# Patient Record
Sex: Female | Born: 1935 | Race: White | Hispanic: No
Health system: Southern US, Community
[De-identification: ages and names within clinical notes are randomized; demographics above are authoritative.]

---

## 2004-10-29 ENCOUNTER — Ambulatory Visit: Payer: Self-pay | Admitting: Unknown Physician Specialty

## 2005-11-25 ENCOUNTER — Ambulatory Visit: Payer: Self-pay | Admitting: Unknown Physician Specialty

## 2006-07-06 ENCOUNTER — Ambulatory Visit: Payer: Self-pay | Admitting: Unknown Physician Specialty

## 2006-12-26 ENCOUNTER — Ambulatory Visit: Payer: Self-pay | Admitting: Ophthalmology

## 2007-02-07 ENCOUNTER — Ambulatory Visit: Payer: Self-pay | Admitting: Unknown Physician Specialty

## 2007-12-26 ENCOUNTER — Ambulatory Visit: Payer: Self-pay | Admitting: Unknown Physician Specialty

## 2008-01-11 ENCOUNTER — Ambulatory Visit: Payer: Self-pay | Admitting: Unknown Physician Specialty

## 2008-01-25 ENCOUNTER — Ambulatory Visit: Payer: Self-pay | Admitting: Unknown Physician Specialty

## 2008-02-28 ENCOUNTER — Ambulatory Visit: Payer: Self-pay | Admitting: Unknown Physician Specialty

## 2008-08-08 ENCOUNTER — Ambulatory Visit: Payer: Self-pay | Admitting: Unknown Physician Specialty

## 2008-10-15 ENCOUNTER — Ambulatory Visit: Payer: Self-pay | Admitting: Unknown Physician Specialty

## 2009-03-03 ENCOUNTER — Ambulatory Visit: Payer: Self-pay | Admitting: Unknown Physician Specialty

## 2009-08-26 ENCOUNTER — Encounter: Payer: Self-pay | Admitting: Gastroenterology

## 2009-08-26 ENCOUNTER — Inpatient Hospital Stay: Payer: Self-pay | Admitting: Internal Medicine

## 2009-09-05 ENCOUNTER — Encounter: Payer: Self-pay | Admitting: Gastroenterology

## 2009-09-10 ENCOUNTER — Encounter: Payer: Self-pay | Admitting: Gastroenterology

## 2009-10-01 ENCOUNTER — Encounter (INDEPENDENT_AMBULATORY_CARE_PROVIDER_SITE_OTHER): Payer: Self-pay | Admitting: *Deleted

## 2009-10-06 ENCOUNTER — Encounter: Payer: Self-pay | Admitting: Gastroenterology

## 2009-10-06 DIAGNOSIS — K863 Pseudocyst of pancreas: Secondary | ICD-10-CM

## 2009-10-06 DIAGNOSIS — K862 Cyst of pancreas: Secondary | ICD-10-CM | POA: Insufficient documentation

## 2009-10-10 ENCOUNTER — Telehealth: Payer: Self-pay | Admitting: Gastroenterology

## 2009-10-16 ENCOUNTER — Ambulatory Visit: Payer: Self-pay | Admitting: Gastroenterology

## 2009-10-16 ENCOUNTER — Ambulatory Visit (HOSPITAL_COMMUNITY): Admission: RE | Admit: 2009-10-16 | Discharge: 2009-10-16 | Payer: Self-pay | Admitting: Gastroenterology

## 2009-10-16 ENCOUNTER — Encounter: Payer: Self-pay | Admitting: Gastroenterology

## 2009-10-28 ENCOUNTER — Ambulatory Visit: Payer: Self-pay | Admitting: Gastroenterology

## 2009-11-24 ENCOUNTER — Ambulatory Visit: Payer: Self-pay | Admitting: Unknown Physician Specialty

## 2009-12-09 ENCOUNTER — Telehealth (INDEPENDENT_AMBULATORY_CARE_PROVIDER_SITE_OTHER): Payer: Self-pay | Admitting: *Deleted

## 2010-02-03 ENCOUNTER — Ambulatory Visit: Payer: Self-pay | Admitting: Urology

## 2010-02-16 ENCOUNTER — Encounter (INDEPENDENT_AMBULATORY_CARE_PROVIDER_SITE_OTHER): Payer: Self-pay | Admitting: *Deleted

## 2010-04-20 ENCOUNTER — Ambulatory Visit: Payer: Self-pay | Admitting: Unknown Physician Specialty

## 2010-07-05 ENCOUNTER — Observation Stay: Payer: Self-pay | Admitting: Internal Medicine

## 2010-08-12 ENCOUNTER — Ambulatory Visit: Payer: Self-pay | Admitting: Unknown Physician Specialty

## 2010-12-26 ENCOUNTER — Inpatient Hospital Stay: Payer: Self-pay | Admitting: Specialist

## 2011-01-12 NOTE — Letter (Signed)
Summary: Appointment Reminder  [facility]  [address] [place], [place] [address]   Phone: [phone]  Fax: [phone]        February 16, 2010 MRN: [id number]    Caitlin Caldwell [address] [place], [place]  [address]    Dear Ms. Vandruff,   We have been made aware that you cancelled an appointment that was  scheduled for you with Dr Donell Beers at [facility].  It is  very important that you reschedule this appointment to follow up with  your Pancrease, this could potentially lead to cancer.  Please call to  reschedule the appointment that was recommended.  We hope that you   allow [place] to participate in your health care needs. Please contact us at  [phone] at your earliest convenience to schedule your appointment.     Sincerely,    Chales Abrahams CMA (AAMA)  Appended Document: Appointment Reminder letter mailed

## 2011-03-18 ENCOUNTER — Ambulatory Visit: Payer: Self-pay | Admitting: Unknown Physician Specialty

## 2011-08-25 ENCOUNTER — Ambulatory Visit: Payer: Self-pay | Admitting: Unknown Physician Specialty

## 2011-09-06 ENCOUNTER — Ambulatory Visit: Payer: Self-pay | Admitting: Unknown Physician Specialty

## 2011-11-11 ENCOUNTER — Emergency Department: Payer: Self-pay | Admitting: Emergency Medicine

## 2011-12-15 ENCOUNTER — Emergency Department: Payer: Self-pay

## 2011-12-15 LAB — COMPREHENSIVE METABOLIC PANEL
Albumin: 3.7 g/dL (ref 3.4–5.0)
Alkaline Phosphatase: 56 U/L (ref 50–136)
Bilirubin,Total: 0.4 mg/dL (ref 0.2–1.0)
Co2: 23 mmol/L (ref 21–32)
Creatinine: 0.92 mg/dL (ref 0.60–1.30)
EGFR (Non-African Amer.): >60
Glucose: 286 mg/dL — ABNORMAL HIGH (ref 65–99)
Osmolality: 293 (ref 275–301)
Potassium: 4.5 mmol/L (ref 3.5–5.1)
SGPT (ALT): 8 U/L — ABNORMAL LOW
Sodium: 141 mmol/L (ref 136–145)
Total Protein: 7.4 g/dL (ref 6.4–8.2)

## 2011-12-15 LAB — URINALYSIS, COMPLETE
Glucose,UR: 150 mg/dL (ref 0–75)
Nitrite: NEGATIVE
Ph: 5 (ref 4.5–8.0)
Protein: 100
RBC,UR: 8 /HPF (ref 0–5)
Specific Gravity: 1.016 (ref 1.003–1.030)
WBC UR: 132 /HPF (ref 0–5)

## 2011-12-15 LAB — CBC
HGB: 10.9 g/dL — ABNORMAL LOW (ref 12.0–16.0)
MCH: 32.5 pg (ref 26.0–34.0)
MCHC: 35.1 g/dL (ref 32.0–36.0)
Platelet: 234 10*3/uL (ref 150–440)
RBC: 3.36 10*6/uL — ABNORMAL LOW (ref 3.80–5.20)
WBC: 12.9 10*3/uL — ABNORMAL HIGH (ref 3.6–11.0)

## 2011-12-17 LAB — URINE CULTURE

## 2012-01-19 ENCOUNTER — Ambulatory Visit: Payer: Self-pay | Admitting: Unknown Physician Specialty

## 2012-02-24 ENCOUNTER — Ambulatory Visit: Payer: Self-pay | Admitting: Unknown Physician Specialty

## 2012-03-08 ENCOUNTER — Ambulatory Visit: Payer: Self-pay | Admitting: Unknown Physician Specialty

## 2012-03-08 LAB — CREATININE, SERUM
Creatinine: 1.02 mg/dL (ref 0.60–1.30)
EGFR (African American): >60

## 2012-06-07 ENCOUNTER — Other Ambulatory Visit: Payer: Self-pay | Admitting: Surgery

## 2012-06-07 LAB — CBC WITH DIFFERENTIAL/PLATELET
Basophil %: 0.4 %
Eosinophil %: 0.8 %
HCT: 29.5 % — ABNORMAL LOW (ref 35.0–47.0)
Lymphocyte #: 1.6 10*3/uL (ref 1.0–3.6)
Lymphocyte %: 22 %
Monocyte %: 7.9 %

## 2012-06-07 LAB — COMPREHENSIVE METABOLIC PANEL
Anion Gap: 9 (ref 7–16)
Co2: 19 mmol/L — ABNORMAL LOW (ref 21–32)
Creatinine: 1.05 mg/dL (ref 0.60–1.30)
EGFR (African American): >60
EGFR (Non-African Amer.): 52 — ABNORMAL LOW
Osmolality: 286 (ref 275–301)
Potassium: 4.6 mmol/L (ref 3.5–5.1)
SGOT(AST): 17 U/L (ref 15–37)
SGPT (ALT): 15 U/L
Sodium: 139 mmol/L (ref 136–145)

## 2012-06-08 ENCOUNTER — Ambulatory Visit: Payer: Self-pay | Admitting: Surgery

## 2012-12-06 ENCOUNTER — Inpatient Hospital Stay: Payer: Self-pay | Admitting: Internal Medicine

## 2012-12-06 LAB — COMPREHENSIVE METABOLIC PANEL
Albumin: 3.5 g/dL (ref 3.4–5.0)
Anion Gap: 12 (ref 7–16)
Bilirubin,Total: 0.7 mg/dL (ref 0.2–1.0)
Chloride: 98 mmol/L (ref 98–107)
Co2: 27 mmol/L (ref 21–32)
Creatinine: 1.94 mg/dL — ABNORMAL HIGH (ref 0.60–1.30)
EGFR (African American): 28 — ABNORMAL LOW
EGFR (Non-African Amer.): 25 — ABNORMAL LOW
Osmolality: 309 (ref 275–301)
SGPT (ALT): 8 U/L — ABNORMAL LOW (ref 12–78)
Sodium: 137 mmol/L (ref 136–145)

## 2012-12-06 LAB — CBC
HGB: 9.6 g/dL — ABNORMAL LOW (ref 12.0–16.0)
MCHC: 30.4 g/dL — ABNORMAL LOW (ref 32.0–36.0)
MCV: 92 fL (ref 80–100)
Platelet: 218 10*3/uL (ref 150–440)
RBC: 3.43 10*6/uL — ABNORMAL LOW (ref 3.80–5.20)
RDW: 15.5 % — ABNORMAL HIGH (ref 11.5–14.5)
WBC: 10.6 10*3/uL (ref 3.6–11.0)

## 2012-12-06 LAB — URINALYSIS, COMPLETE
Bilirubin,UR: NEGATIVE
Glucose,UR: 150 mg/dL (ref 0–75)
RBC,UR: 25 /HPF (ref 0–5)
Specific Gravity: 1.01 (ref 1.003–1.030)
Squamous Epithelial: <1
WBC UR: 142 /HPF (ref 0–5)

## 2012-12-06 LAB — PRO B NATRIURETIC PEPTIDE: B-Type Natriuretic Peptide: 26346 pg/mL — ABNORMAL HIGH (ref 0–450)

## 2012-12-07 LAB — LIPID PANEL
HDL Cholesterol: 29 mg/dL — ABNORMAL LOW (ref 40–60)
Ldl Cholesterol, Calc: 38 mg/dL (ref 0–100)
Triglycerides: 105 mg/dL (ref 0–200)

## 2012-12-07 LAB — CBC WITH DIFFERENTIAL/PLATELET
Basophil #: 0 10*3/uL (ref 0.0–0.1)
Eosinophil #: 0 10*3/uL (ref 0.0–0.7)
Eosinophil %: 0.2 %
HCT: 27.3 % — ABNORMAL LOW (ref 35.0–47.0)
Lymphocyte #: 1.3 10*3/uL (ref 1.0–3.6)
MCHC: 32.2 g/dL (ref 32.0–36.0)
Monocyte #: 0.8 x10 3/mm (ref 0.2–0.9)
RBC: 3.04 10*6/uL — ABNORMAL LOW (ref 3.80–5.20)
RDW: 15.5 % — ABNORMAL HIGH (ref 11.5–14.5)
WBC: 7.7 10*3/uL (ref 3.6–11.0)

## 2012-12-07 LAB — CK TOTAL AND CKMB (NOT AT ARMC)
CK, Total: 67 U/L (ref 21–215)
CK-MB: <0.5 ng/mL — ABNORMAL LOW (ref 0.5–3.6)
CK-MB: 0.8 ng/mL (ref 0.5–3.6)

## 2012-12-07 LAB — BASIC METABOLIC PANEL
Calcium, Total: 9.1 mg/dL (ref 8.5–10.1)
Chloride: 100 mmol/L (ref 98–107)
Co2: 31 mmol/L (ref 21–32)
EGFR (Non-African Amer.): 22 — ABNORMAL LOW
Osmolality: 310 (ref 275–301)
Potassium: 4.3 mmol/L (ref 3.5–5.1)
Sodium: 139 mmol/L (ref 136–145)

## 2012-12-07 LAB — TROPONIN I: Troponin-I: 0.04 ng/mL

## 2012-12-08 LAB — CBC WITH DIFFERENTIAL/PLATELET
Basophil #: 0 10*3/uL (ref 0.0–0.1)
Basophil %: 0.3 %
Eosinophil %: 2.4 %
HGB: 8.7 g/dL — ABNORMAL LOW (ref 12.0–16.0)
Lymphocyte #: 1.7 10*3/uL (ref 1.0–3.6)
MCH: 29.6 pg (ref 26.0–34.0)
MCV: 91 fL (ref 80–100)
Monocyte #: 0.9 x10 3/mm (ref 0.2–0.9)
Neutrophil #: 6.1 10*3/uL (ref 1.4–6.5)
Neutrophil %: 67.7 %
RBC: 2.95 10*6/uL — ABNORMAL LOW (ref 3.80–5.20)
RDW: 15.4 % — ABNORMAL HIGH (ref 11.5–14.5)

## 2012-12-08 LAB — BASIC METABOLIC PANEL
Chloride: 102 mmol/L (ref 98–107)
Co2: 29 mmol/L (ref 21–32)
Creatinine: 2.36 mg/dL — ABNORMAL HIGH (ref 0.60–1.30)
Potassium: 3.3 mmol/L — ABNORMAL LOW (ref 3.5–5.1)
Sodium: 141 mmol/L (ref 136–145)

## 2012-12-08 LAB — MAGNESIUM: Magnesium: 2.4 mg/dL

## 2012-12-08 LAB — HEMOGLOBIN A1C: Hemoglobin A1C: 6.9 % — ABNORMAL HIGH (ref 4.2–6.3)

## 2012-12-09 LAB — BASIC METABOLIC PANEL
Anion Gap: 9 (ref 7–16)
BUN: 79 mg/dL — ABNORMAL HIGH (ref 7–18)
Calcium, Total: 8.5 mg/dL (ref 8.5–10.1)
EGFR (African American): 20 — ABNORMAL LOW
EGFR (Non-African Amer.): 17 — ABNORMAL LOW
Glucose: 212 mg/dL — ABNORMAL HIGH (ref 65–99)
Osmolality: 302 (ref 275–301)

## 2012-12-10 LAB — BASIC METABOLIC PANEL
Calcium, Total: 8.6 mg/dL (ref 8.5–10.1)
Chloride: 100 mmol/L (ref 98–107)
Co2: 26 mmol/L (ref 21–32)
EGFR (African American): 23 — ABNORMAL LOW
Glucose: 174 mg/dL — ABNORMAL HIGH (ref 65–99)
Osmolality: 299 (ref 275–301)
Sodium: 136 mmol/L (ref 136–145)

## 2012-12-11 LAB — BASIC METABOLIC PANEL
BUN: 71 mg/dL — ABNORMAL HIGH (ref 7–18)
Chloride: 103 mmol/L (ref 98–107)
EGFR (African American): 29 — ABNORMAL LOW
EGFR (Non-African Amer.): 25 — ABNORMAL LOW
Glucose: 166 mg/dL — ABNORMAL HIGH (ref 65–99)
Osmolality: 298 (ref 275–301)
Potassium: 3.5 mmol/L (ref 3.5–5.1)

## 2012-12-12 LAB — BASIC METABOLIC PANEL
BUN: 68 mg/dL — ABNORMAL HIGH (ref 7–18)
Chloride: 103 mmol/L (ref 98–107)
Co2: 26 mmol/L (ref 21–32)
Creatinine: 2.05 mg/dL — ABNORMAL HIGH (ref 0.60–1.30)
EGFR (Non-African Amer.): 23 — ABNORMAL LOW
Potassium: 3.7 mmol/L (ref 3.5–5.1)
Sodium: 139 mmol/L (ref 136–145)

## 2012-12-12 LAB — PLATELET COUNT: Platelet: 206 10*3/uL (ref 150–440)

## 2012-12-13 LAB — BASIC METABOLIC PANEL
Chloride: 105 mmol/L (ref 98–107)
Co2: 25 mmol/L (ref 21–32)
EGFR (African American): 28 — ABNORMAL LOW
Osmolality: 302 (ref 275–301)

## 2012-12-14 LAB — BASIC METABOLIC PANEL
Anion Gap: 9 (ref 7–16)
Calcium, Total: 9 mg/dL (ref 8.5–10.1)
Chloride: 104 mmol/L (ref 98–107)
Co2: 25 mmol/L (ref 21–32)
Creatinine: 1.9 mg/dL — ABNORMAL HIGH (ref 0.60–1.30)
EGFR (African American): 29 — ABNORMAL LOW
Osmolality: 299 (ref 275–301)
Potassium: 4 mmol/L (ref 3.5–5.1)
Sodium: 138 mmol/L (ref 136–145)

## 2012-12-15 LAB — BASIC METABOLIC PANEL
Anion Gap: 11 (ref 7–16)
BUN: 62 mg/dL — ABNORMAL HIGH (ref 7–18)
Calcium, Total: 8.8 mg/dL (ref 8.5–10.1)
Creatinine: 1.84 mg/dL — ABNORMAL HIGH (ref 0.60–1.30)
Osmolality: 299 (ref 275–301)
Potassium: 4.1 mmol/L (ref 3.5–5.1)
Sodium: 138 mmol/L (ref 136–145)

## 2013-01-03 ENCOUNTER — Inpatient Hospital Stay: Payer: Self-pay | Admitting: Family Medicine

## 2013-01-03 LAB — CK TOTAL AND CKMB (NOT AT ARMC)
CK, Total: 76 U/L (ref 21–215)
CK, Total: 62 U/L (ref 21–215)
CK-MB: 1.8 ng/mL (ref 0.5–3.6)
CK-MB: 1.4 ng/mL (ref 0.5–3.6)

## 2013-01-03 LAB — COMPREHENSIVE METABOLIC PANEL
Albumin: 3.8 g/dL (ref 3.4–5.0)
BUN: 39 mg/dL — ABNORMAL HIGH (ref 7–18)
Bilirubin,Total: 0.4 mg/dL (ref 0.2–1.0)
Calcium, Total: 9.4 mg/dL (ref 8.5–10.1)
Chloride: 108 mmol/L — ABNORMAL HIGH (ref 98–107)
Co2: 25 mmol/L (ref 21–32)
Creatinine: 1.46 mg/dL — ABNORMAL HIGH (ref 0.60–1.30)
EGFR (African American): 40 — ABNORMAL LOW
EGFR (Non-African Amer.): 35 — ABNORMAL LOW
Glucose: 17 mg/dL — CL (ref 65–99)
SGOT(AST): 27 U/L (ref 15–37)

## 2013-01-03 LAB — URINALYSIS, COMPLETE
Bilirubin,UR: NEGATIVE
Glucose,UR: NEGATIVE mg/dL (ref 0–75)
Leukocyte Esterase: NEGATIVE
Protein: 30
RBC,UR: 7 /HPF (ref 0–5)
Specific Gravity: 1.009 (ref 1.003–1.030)
WBC UR: <1 /HPF (ref 0–5)

## 2013-01-03 LAB — CBC
HGB: 11.1 g/dL — ABNORMAL LOW (ref 12.0–16.0)
Platelet: 241 10*3/uL (ref 150–440)
RDW: 16.8 % — ABNORMAL HIGH (ref 11.5–14.5)

## 2013-01-03 LAB — TROPONIN I: Troponin-I: 0.04 ng/mL

## 2013-01-03 LAB — PRO B NATRIURETIC PEPTIDE: B-Type Natriuretic Peptide: 28576 pg/mL — ABNORMAL HIGH (ref 0–450)

## 2013-01-04 LAB — CBC WITH DIFFERENTIAL/PLATELET
Basophil #: 0.1 10*3/uL (ref 0.0–0.1)
Eosinophil #: 0.1 10*3/uL (ref 0.0–0.7)
Eosinophil %: 1.6 %
HCT: 27.5 % — ABNORMAL LOW (ref 35.0–47.0)
HGB: 9 g/dL — ABNORMAL LOW (ref 12.0–16.0)
Lymphocyte #: 1.1 10*3/uL (ref 1.0–3.6)
Lymphocyte %: 15.4 %
MCH: 29.6 pg (ref 26.0–34.0)
MCV: 90 fL (ref 80–100)
Monocyte #: 1 x10 3/mm — ABNORMAL HIGH (ref 0.2–0.9)
Monocyte %: 13.3 %
Neutrophil #: 5 10*3/uL (ref 1.4–6.5)
Neutrophil %: 68.9 %
Platelet: 181 10*3/uL (ref 150–440)
RBC: 3.04 10*6/uL — ABNORMAL LOW (ref 3.80–5.20)
RDW: 16.7 % — ABNORMAL HIGH (ref 11.5–14.5)

## 2013-01-04 LAB — COMPREHENSIVE METABOLIC PANEL
Alkaline Phosphatase: 100 U/L (ref 50–136)
Anion Gap: 12 (ref 7–16)
Bilirubin,Total: 0.3 mg/dL (ref 0.2–1.0)
Calcium, Total: 8.6 mg/dL (ref 8.5–10.1)
Chloride: 106 mmol/L (ref 98–107)
Co2: 23 mmol/L (ref 21–32)
Creatinine: 1.63 mg/dL — ABNORMAL HIGH (ref 0.60–1.30)
EGFR (Non-African Amer.): 30 — ABNORMAL LOW
Glucose: 63 mg/dL — ABNORMAL LOW (ref 65–99)
SGPT (ALT): 11 U/L — ABNORMAL LOW (ref 12–78)
Total Protein: 6.5 g/dL (ref 6.4–8.2)

## 2013-01-04 LAB — TROPONIN I: Troponin-I: 0.04 ng/mL

## 2013-01-04 LAB — CK TOTAL AND CKMB (NOT AT ARMC): CK, Total: 50 U/L (ref 21–215)

## 2013-01-05 LAB — BASIC METABOLIC PANEL
Anion Gap: 7 (ref 7–16)
BUN: 43 mg/dL — ABNORMAL HIGH (ref 7–18)
Calcium, Total: 8.7 mg/dL (ref 8.5–10.1)
Co2: 26 mmol/L (ref 21–32)
Glucose: 86 mg/dL (ref 65–99)
Osmolality: 284 (ref 275–301)

## 2013-01-06 LAB — BASIC METABOLIC PANEL
BUN: 39 mg/dL — ABNORMAL HIGH (ref 7–18)
Creatinine: 1.91 mg/dL — ABNORMAL HIGH (ref 0.60–1.30)
EGFR (African American): 29 — ABNORMAL LOW
EGFR (Non-African Amer.): 25 — ABNORMAL LOW
Glucose: 100 mg/dL — ABNORMAL HIGH (ref 65–99)
Potassium: 3.9 mmol/L (ref 3.5–5.1)
Sodium: 138 mmol/L (ref 136–145)

## 2013-01-07 LAB — BASIC METABOLIC PANEL
Anion Gap: 9 (ref 7–16)
BUN: 43 mg/dL — ABNORMAL HIGH (ref 7–18)
Chloride: 104 mmol/L (ref 98–107)
Co2: 24 mmol/L (ref 21–32)
Creatinine: 1.79 mg/dL — ABNORMAL HIGH (ref 0.60–1.30)
EGFR (African American): 31 — ABNORMAL LOW
EGFR (Non-African Amer.): 27 — ABNORMAL LOW
Osmolality: 283 (ref 275–301)

## 2013-01-07 LAB — CBC WITH DIFFERENTIAL/PLATELET
Basophil #: 0.1 10*3/uL (ref 0.0–0.1)
Eosinophil #: 0.1 10*3/uL (ref 0.0–0.7)
Eosinophil %: 2.2 %
HCT: 28.2 % — ABNORMAL LOW (ref 35.0–47.0)
Lymphocyte #: 1.1 10*3/uL (ref 1.0–3.6)
Lymphocyte %: 17.4 %
Monocyte #: 1 x10 3/mm — ABNORMAL HIGH (ref 0.2–0.9)
Monocyte %: 15.2 %
Neutrophil #: 4.2 10*3/uL (ref 1.4–6.5)
Platelet: 190 10*3/uL (ref 150–440)
RBC: 3.13 10*6/uL — ABNORMAL LOW (ref 3.80–5.20)
WBC: 6.6 10*3/uL (ref 3.6–11.0)

## 2013-01-08 LAB — BASIC METABOLIC PANEL
Anion Gap: 11 (ref 7–16)
BUN: 41 mg/dL — ABNORMAL HIGH (ref 7–18)
Chloride: 104 mmol/L (ref 98–107)
Co2: 22 mmol/L (ref 21–32)
EGFR (African American): 34 — ABNORMAL LOW
EGFR (Non-African Amer.): 29 — ABNORMAL LOW
Glucose: 114 mg/dL — ABNORMAL HIGH (ref 65–99)
Osmolality: 285 (ref 275–301)
Sodium: 137 mmol/L (ref 136–145)

## 2013-01-09 LAB — CULTURE, BLOOD (SINGLE)

## 2013-02-10 LAB — COMPREHENSIVE METABOLIC PANEL
Alkaline Phosphatase: 96 U/L (ref 50–136)
Anion Gap: 11 (ref 7–16)
BUN: 51 mg/dL — ABNORMAL HIGH (ref 7–18)
Calcium, Total: 9.2 mg/dL (ref 8.5–10.1)
Chloride: 107 mmol/L (ref 98–107)
Co2: 23 mmol/L (ref 21–32)
Creatinine: 1.67 mg/dL — ABNORMAL HIGH (ref 0.60–1.30)
EGFR (Non-African Amer.): 29 — ABNORMAL LOW
Glucose: 138 mg/dL — ABNORMAL HIGH (ref 65–99)
Osmolality: 297 (ref 275–301)
SGPT (ALT): 13 U/L (ref 12–78)
Total Protein: 7.2 g/dL (ref 6.4–8.2)

## 2013-02-10 LAB — CBC WITH DIFFERENTIAL/PLATELET
Basophil %: 1.2 %
Eosinophil %: 1.7 %
HGB: 9.8 g/dL — ABNORMAL LOW (ref 12.0–16.0)
Lymphocyte #: 1 10*3/uL (ref 1.0–3.6)
Lymphocyte %: 14.1 %
MCH: 28.6 pg (ref 26.0–34.0)
MCHC: 31.9 g/dL — ABNORMAL LOW (ref 32.0–36.0)
MCV: 90 fL (ref 80–100)
Monocyte #: 0.6 x10 3/mm (ref 0.2–0.9)
Monocyte %: 9.1 %
Neutrophil #: 5.2 10*3/uL (ref 1.4–6.5)
Neutrophil %: 73.9 %
Platelet: 231 10*3/uL (ref 150–440)
RBC: 3.43 10*6/uL — ABNORMAL LOW (ref 3.80–5.20)

## 2013-02-10 LAB — TROPONIN I: Troponin-I: <0.02 ng/mL

## 2013-02-10 LAB — CK TOTAL AND CKMB (NOT AT ARMC)
CK, Total: 84 U/L (ref 21–215)
CK-MB: 1 ng/mL (ref 0.5–3.6)

## 2013-02-10 LAB — PRO B NATRIURETIC PEPTIDE: B-Type Natriuretic Peptide: 15748 pg/mL — ABNORMAL HIGH (ref 0–450)

## 2013-02-11 ENCOUNTER — Inpatient Hospital Stay: Payer: Self-pay | Admitting: Internal Medicine

## 2013-02-11 LAB — CK TOTAL AND CKMB (NOT AT ARMC)
CK, Total: 84 U/L (ref 21–215)
CK-MB: 1.1 ng/mL (ref 0.5–3.6)

## 2013-02-12 LAB — BASIC METABOLIC PANEL
Anion Gap: 8 (ref 7–16)
BUN: 51 mg/dL — ABNORMAL HIGH (ref 7–18)
Calcium, Total: 8.7 mg/dL (ref 8.5–10.1)
Chloride: 107 mmol/L (ref 98–107)
Creatinine: 1.66 mg/dL — ABNORMAL HIGH (ref 0.60–1.30)
Osmolality: 292 (ref 275–301)
Potassium: 3.6 mmol/L (ref 3.5–5.1)
Sodium: 140 mmol/L (ref 136–145)

## 2013-02-12 LAB — URINE CULTURE

## 2013-02-12 LAB — CBC WITH DIFFERENTIAL/PLATELET
Basophil #: 0.1 10*3/uL (ref 0.0–0.1)
Basophil %: 0.8 %
Eosinophil %: 1.6 %
HCT: 28.1 % — ABNORMAL LOW (ref 35.0–47.0)
HGB: 10 g/dL — ABNORMAL LOW (ref 12.0–16.0)
Lymphocyte #: 1.4 10*3/uL (ref 1.0–3.6)
MCV: 89 fL (ref 80–100)
Monocyte #: 0.7 x10 3/mm (ref 0.2–0.9)
Monocyte %: 11.2 %
Neutrophil #: 4.3 10*3/uL (ref 1.4–6.5)
Platelet: 212 10*3/uL (ref 150–440)
RDW: 16.8 % — ABNORMAL HIGH (ref 11.5–14.5)
WBC: 6.6 10*3/uL (ref 3.6–11.0)

## 2013-03-28 LAB — CBC
HCT: 19.1 % — ABNORMAL LOW (ref 35.0–47.0)
HGB: 6.2 g/dL — ABNORMAL LOW (ref 12.0–16.0)
MCH: 29.7 pg (ref 26.0–34.0)
MCHC: 32.4 g/dL (ref 32.0–36.0)
RBC: 2.09 10*6/uL — ABNORMAL LOW (ref 3.80–5.20)

## 2013-03-28 LAB — COMPREHENSIVE METABOLIC PANEL
Albumin: 2.9 g/dL — ABNORMAL LOW (ref 3.4–5.0)
Anion Gap: 10 (ref 7–16)
BUN: 86 mg/dL — ABNORMAL HIGH (ref 7–18)
Bilirubin,Total: 0.3 mg/dL (ref 0.2–1.0)
Chloride: 109 mmol/L — ABNORMAL HIGH (ref 98–107)
Creatinine: 1.89 mg/dL — ABNORMAL HIGH (ref 0.60–1.30)
EGFR (African American): 29 — ABNORMAL LOW
Potassium: 4.9 mmol/L (ref 3.5–5.1)
SGPT (ALT): 9 U/L — ABNORMAL LOW (ref 12–78)
Sodium: 139 mmol/L (ref 136–145)
Total Protein: 6.6 g/dL (ref 6.4–8.2)

## 2013-03-28 LAB — PROTIME-INR
INR: 1.5
Prothrombin Time: 17.8 secs — ABNORMAL HIGH (ref 11.5–14.7)

## 2013-03-28 LAB — LIPASE, BLOOD: Lipase: 149 U/L (ref 73–393)

## 2013-03-29 ENCOUNTER — Inpatient Hospital Stay: Payer: Self-pay | Admitting: Internal Medicine

## 2013-03-29 LAB — URINALYSIS, COMPLETE
Bacteria: NONE SEEN
Bilirubin,UR: NEGATIVE
Blood: NEGATIVE
Ketone: NEGATIVE
Leukocyte Esterase: NEGATIVE
Protein: NEGATIVE
WBC UR: NONE SEEN /HPF (ref 0–5)

## 2013-03-29 LAB — CBC
HCT: 24.9 % — ABNORMAL LOW (ref 35.0–47.0)
HGB: 8.1 g/dL — ABNORMAL LOW (ref 12.0–16.0)
MCH: 28.3 pg (ref 26.0–34.0)
MCHC: 32.6 g/dL (ref 32.0–36.0)
Platelet: 169 10*3/uL (ref 150–440)
RBC: 2.87 10*6/uL — ABNORMAL LOW (ref 3.80–5.20)
WBC: 9.7 10*3/uL (ref 3.6–11.0)

## 2013-03-29 LAB — PROTIME-INR: INR: 1.6

## 2013-03-30 LAB — BASIC METABOLIC PANEL
BUN: 86 mg/dL — ABNORMAL HIGH (ref 7–18)
Calcium, Total: 8.2 mg/dL — ABNORMAL LOW (ref 8.5–10.1)
Chloride: 116 mmol/L — ABNORMAL HIGH (ref 98–107)
Co2: 17 mmol/L — ABNORMAL LOW (ref 21–32)
Creatinine: 1.75 mg/dL — ABNORMAL HIGH (ref 0.60–1.30)
EGFR (African American): 32 — ABNORMAL LOW
Glucose: 186 mg/dL — ABNORMAL HIGH (ref 65–99)
Osmolality: 320 (ref 275–301)
Potassium: 4.7 mmol/L (ref 3.5–5.1)

## 2013-03-30 LAB — CBC WITH DIFFERENTIAL/PLATELET
Basophil #: 0 10*3/uL (ref 0.0–0.1)
Basophil #: 0.1 10*3/uL (ref 0.0–0.1)
Basophil %: 0.1 %
Basophil %: 0.5 %
Basophil %: 0.6 %
Eosinophil #: 0 10*3/uL (ref 0.0–0.7)
Eosinophil #: 0 10*3/uL (ref 0.0–0.7)
Eosinophil #: 0.1 10*3/uL (ref 0.0–0.7)
Eosinophil %: 0.1 %
Eosinophil %: 0.6 %
HCT: 18.8 % — ABNORMAL LOW (ref 35.0–47.0)
HGB: 6.1 g/dL — ABNORMAL LOW (ref 12.0–16.0)
HGB: 8 g/dL — ABNORMAL LOW (ref 12.0–16.0)
Lymphocyte #: 1 10*3/uL (ref 1.0–3.6)
Lymphocyte %: 10.6 %
Lymphocyte %: 11.2 %
Lymphocyte %: 7.7 %
MCH: 29.2 pg (ref 26.0–34.0)
MCHC: 32.7 g/dL (ref 32.0–36.0)
MCHC: 34.3 g/dL (ref 32.0–36.0)
MCHC: 35.2 g/dL (ref 32.0–36.0)
MCV: 82 fL (ref 80–100)
MCV: 83 fL (ref 80–100)
MCV: 87 fL (ref 80–100)
Monocyte #: 1 x10 3/mm — ABNORMAL HIGH (ref 0.2–0.9)
Monocyte #: 1.1 x10 3/mm — ABNORMAL HIGH (ref 0.2–0.9)
Monocyte %: 12.1 %
Monocyte %: 9 %
Neutrophil #: 7.7 10*3/uL — ABNORMAL HIGH (ref 1.4–6.5)
Neutrophil %: 76.1 %
Neutrophil %: 77.2 %
Neutrophil %: 83.1 %
Platelet: 112 10*3/uL — ABNORMAL LOW (ref 150–440)
Platelet: 118 10*3/uL — ABNORMAL LOW (ref 150–440)
RBC: 2.16 10*6/uL — ABNORMAL LOW (ref 3.80–5.20)
RBC: 3.27 10*6/uL — ABNORMAL LOW (ref 3.80–5.20)
RDW: 17.7 % — ABNORMAL HIGH (ref 11.5–14.5)
RDW: 18.1 % — ABNORMAL HIGH (ref 11.5–14.5)
WBC: 8.8 10*3/uL (ref 3.6–11.0)

## 2013-03-30 LAB — PROTIME-INR: Prothrombin Time: 20.2 secs — ABNORMAL HIGH (ref 11.5–14.7)

## 2013-03-30 LAB — HEMOGLOBIN: HGB: 9 g/dL — ABNORMAL LOW (ref 12.0–16.0)

## 2013-03-31 LAB — BASIC METABOLIC PANEL
Anion Gap: 13 (ref 7–16)
BUN: 71 mg/dL — ABNORMAL HIGH (ref 7–18)
Chloride: 118 mmol/L — ABNORMAL HIGH (ref 98–107)
Co2: 17 mmol/L — ABNORMAL LOW (ref 21–32)
EGFR (African American): 35 — ABNORMAL LOW
EGFR (Non-African Amer.): 31 — ABNORMAL LOW
Glucose: 156 mg/dL — ABNORMAL HIGH (ref 65–99)
Sodium: 148 mmol/L — ABNORMAL HIGH (ref 136–145)

## 2013-03-31 LAB — CBC WITH DIFFERENTIAL/PLATELET
Eosinophil %: 1.5 %
HCT: 26.5 % — ABNORMAL LOW (ref 35.0–47.0)
Lymphocyte #: 0.9 10*3/uL — ABNORMAL LOW (ref 1.0–3.6)
MCH: 29.2 pg (ref 26.0–34.0)
MCHC: 34.8 g/dL (ref 32.0–36.0)
MCV: 84 fL (ref 80–100)
Monocyte %: 11.3 %
RBC: 3.17 10*6/uL — ABNORMAL LOW (ref 3.80–5.20)

## 2013-03-31 LAB — HEMOGLOBIN
HGB: 9.3 g/dL — ABNORMAL LOW (ref 12.0–16.0)
HGB: 9 g/dL — ABNORMAL LOW (ref 12.0–16.0)

## 2013-04-01 LAB — BASIC METABOLIC PANEL
BUN: 68 mg/dL — ABNORMAL HIGH (ref 7–18)
Calcium, Total: 8.4 mg/dL — ABNORMAL LOW (ref 8.5–10.1)
Chloride: 119 mmol/L — ABNORMAL HIGH (ref 98–107)
Co2: 19 mmol/L — ABNORMAL LOW (ref 21–32)
Creatinine: 1.59 mg/dL — ABNORMAL HIGH (ref 0.60–1.30)
EGFR (African American): 36 — ABNORMAL LOW
EGFR (Non-African Amer.): 31 — ABNORMAL LOW
Osmolality: 317 (ref 275–301)
Sodium: 149 mmol/L — ABNORMAL HIGH (ref 136–145)

## 2013-04-01 LAB — CBC WITH DIFFERENTIAL/PLATELET
Basophil %: 0.4 %
Eosinophil #: 0.3 10*3/uL (ref 0.0–0.7)
HGB: 8.6 g/dL — ABNORMAL LOW (ref 12.0–16.0)
Lymphocyte #: 0.9 10*3/uL — ABNORMAL LOW (ref 1.0–3.6)
Lymphocyte %: 10.2 %
MCH: 28.9 pg (ref 26.0–34.0)
MCHC: 34 g/dL (ref 32.0–36.0)
Monocyte #: 0.9 x10 3/mm (ref 0.2–0.9)
Monocyte %: 10 %
RBC: 2.98 10*6/uL — ABNORMAL LOW (ref 3.80–5.20)

## 2013-04-02 LAB — CBC WITH DIFFERENTIAL/PLATELET
Basophil #: 0 10*3/uL (ref 0.0–0.1)
Eosinophil %: 3.5 %
HGB: 8.8 g/dL — ABNORMAL LOW (ref 12.0–16.0)
Lymphocyte #: 1 10*3/uL (ref 1.0–3.6)
Lymphocyte %: 13 %
MCH: 29.9 pg (ref 26.0–34.0)
Monocyte #: 0.8 x10 3/mm (ref 0.2–0.9)
Monocyte %: 10.1 %
Neutrophil %: 72.8 %
Platelet: 133 10*3/uL — ABNORMAL LOW (ref 150–440)
RDW: 18.7 % — ABNORMAL HIGH (ref 11.5–14.5)
WBC: 7.7 10*3/uL (ref 3.6–11.0)

## 2013-04-02 LAB — BASIC METABOLIC PANEL
BUN: 59 mg/dL — ABNORMAL HIGH (ref 7–18)
Co2: 17 mmol/L — ABNORMAL LOW (ref 21–32)
EGFR (Non-African Amer.): 32 — ABNORMAL LOW
Glucose: 134 mg/dL — ABNORMAL HIGH (ref 65–99)
Potassium: 3.5 mmol/L (ref 3.5–5.1)
Sodium: 143 mmol/L (ref 136–145)

## 2013-04-03 LAB — CBC WITH DIFFERENTIAL/PLATELET
Basophil #: 0.1 10*3/uL (ref 0.0–0.1)
Basophil %: 0.8 %
Eosinophil %: 2.3 %
HCT: 24 % — ABNORMAL LOW (ref 35.0–47.0)
HGB: 7.8 g/dL — ABNORMAL LOW (ref 12.0–16.0)
MCH: 28.5 pg (ref 26.0–34.0)
MCHC: 32.7 g/dL (ref 32.0–36.0)
MCV: 87 fL (ref 80–100)
Monocyte #: 0.7 x10 3/mm (ref 0.2–0.9)
Neutrophil #: 5.1 10*3/uL (ref 1.4–6.5)
Neutrophil %: 76.6 %
RBC: 2.75 10*6/uL — ABNORMAL LOW (ref 3.80–5.20)
RDW: 18.5 % — ABNORMAL HIGH (ref 11.5–14.5)

## 2013-04-03 LAB — BASIC METABOLIC PANEL
Anion Gap: 11 (ref 7–16)
Calcium, Total: 8.1 mg/dL — ABNORMAL LOW (ref 8.5–10.1)
Chloride: 112 mmol/L — ABNORMAL HIGH (ref 98–107)
Creatinine: 1.35 mg/dL — ABNORMAL HIGH (ref 0.60–1.30)
EGFR (African American): 44 — ABNORMAL LOW
EGFR (Non-African Amer.): 38 — ABNORMAL LOW
Osmolality: 302 (ref 275–301)
Sodium: 143 mmol/L (ref 136–145)

## 2013-04-03 LAB — MAGNESIUM: Magnesium: 1.7 mg/dL — ABNORMAL LOW

## 2013-04-04 LAB — BASIC METABOLIC PANEL
Calcium, Total: 8.2 mg/dL — ABNORMAL LOW (ref 8.5–10.1)
Chloride: 111 mmol/L — ABNORMAL HIGH (ref 98–107)
Co2: 22 mmol/L (ref 21–32)
Creatinine: 1.29 mg/dL (ref 0.60–1.30)
EGFR (African American): 47 — ABNORMAL LOW
Osmolality: 296 (ref 275–301)
Potassium: 3.4 mmol/L — ABNORMAL LOW (ref 3.5–5.1)

## 2013-04-04 LAB — CBC WITH DIFFERENTIAL/PLATELET
Basophil #: 0 10*3/uL (ref 0.0–0.1)
Basophil %: 0.4 %
Eosinophil #: 0.1 10*3/uL (ref 0.0–0.7)
HGB: 8.4 g/dL — ABNORMAL LOW (ref 12.0–16.0)
Lymphocyte #: 0.7 10*3/uL — ABNORMAL LOW (ref 1.0–3.6)
Lymphocyte %: 9.9 %
MCH: 29.6 pg (ref 26.0–34.0)
MCHC: 33.8 g/dL (ref 32.0–36.0)
MCV: 87 fL (ref 80–100)
Monocyte %: 12.3 %
Neutrophil #: 5.5 10*3/uL (ref 1.4–6.5)
Neutrophil %: 75.9 %
Platelet: 136 10*3/uL — ABNORMAL LOW (ref 150–440)
RDW: 18.7 % — ABNORMAL HIGH (ref 11.5–14.5)

## 2013-04-05 LAB — CBC WITH DIFFERENTIAL/PLATELET
Basophil #: 0 10*3/uL (ref 0.0–0.1)
Eosinophil #: 0.1 10*3/uL (ref 0.0–0.7)
Eosinophil %: 1.6 %
HCT: 25.3 % — ABNORMAL LOW (ref 35.0–47.0)
Lymphocyte #: 0.7 10*3/uL — ABNORMAL LOW (ref 1.0–3.6)
MCH: 29.9 pg (ref 26.0–34.0)
MCHC: 34 g/dL (ref 32.0–36.0)
Monocyte %: 12 %
Neutrophil #: 6.1 10*3/uL (ref 1.4–6.5)
Neutrophil %: 77.1 %
RBC: 2.87 10*6/uL — ABNORMAL LOW (ref 3.80–5.20)
RDW: 19.2 % — ABNORMAL HIGH (ref 11.5–14.5)
WBC: 7.9 10*3/uL (ref 3.6–11.0)

## 2013-04-05 LAB — BASIC METABOLIC PANEL
Anion Gap: 7 (ref 7–16)
BUN: 32 mg/dL — ABNORMAL HIGH (ref 7–18)
Co2: 23 mmol/L (ref 21–32)
Glucose: 217 mg/dL — ABNORMAL HIGH (ref 65–99)
Osmolality: 293 (ref 275–301)
Potassium: 3.7 mmol/L (ref 3.5–5.1)

## 2013-04-06 LAB — MAGNESIUM: Magnesium: 1.7 mg/dL — ABNORMAL LOW

## 2013-04-06 LAB — CBC WITH DIFFERENTIAL/PLATELET
Basophil #: 0 10*3/uL (ref 0.0–0.1)
Basophil %: 0.5 %
Eosinophil #: 0.1 10*3/uL (ref 0.0–0.7)
Eosinophil %: 2 %
HCT: 24.5 % — ABNORMAL LOW (ref 35.0–47.0)
HGB: 8.2 g/dL — ABNORMAL LOW (ref 12.0–16.0)
Lymphocyte %: 10.1 %
MCH: 29.5 pg (ref 26.0–34.0)
MCHC: 33.5 g/dL (ref 32.0–36.0)
Monocyte #: 0.8 x10 3/mm (ref 0.2–0.9)
Monocyte %: 11.4 %
Neutrophil #: 5.1 10*3/uL (ref 1.4–6.5)
Neutrophil %: 76 %
RBC: 2.78 10*6/uL — ABNORMAL LOW (ref 3.80–5.20)
RDW: 19.6 % — ABNORMAL HIGH (ref 11.5–14.5)
WBC: 6.7 10*3/uL (ref 3.6–11.0)

## 2013-04-07 LAB — BASIC METABOLIC PANEL
Anion Gap: 8 (ref 7–16)
Calcium, Total: 8.5 mg/dL (ref 8.5–10.1)
Chloride: 109 mmol/L — ABNORMAL HIGH (ref 98–107)
Co2: 24 mmol/L (ref 21–32)
EGFR (Non-African Amer.): 44 — ABNORMAL LOW
Glucose: 132 mg/dL — ABNORMAL HIGH (ref 65–99)
Osmolality: 289 (ref 275–301)
Sodium: 141 mmol/L (ref 136–145)

## 2013-04-07 LAB — CBC WITH DIFFERENTIAL/PLATELET
Basophil #: 0.1 10*3/uL (ref 0.0–0.1)
Basophil %: 0.6 %
Eosinophil #: 0.1 10*3/uL (ref 0.0–0.7)
Eosinophil %: 1.4 %
HGB: 9 g/dL — ABNORMAL LOW (ref 12.0–16.0)
Lymphocyte %: 8.5 %
MCH: 29.8 pg (ref 26.0–34.0)
MCV: 89 fL (ref 80–100)
Monocyte %: 10.4 %
Neutrophil #: 7.5 10*3/uL — ABNORMAL HIGH (ref 1.4–6.5)
Platelet: 199 10*3/uL (ref 150–440)
WBC: 9.5 10*3/uL (ref 3.6–11.0)

## 2013-07-08 ENCOUNTER — Inpatient Hospital Stay: Payer: Self-pay | Admitting: Internal Medicine

## 2013-07-08 LAB — TROPONIN I: Troponin-I: <0.02 ng/mL

## 2013-07-08 LAB — BASIC METABOLIC PANEL
Anion Gap: 7 (ref 7–16)
Calcium, Total: 9 mg/dL (ref 8.5–10.1)
Creatinine: 1.87 mg/dL — ABNORMAL HIGH (ref 0.60–1.30)
EGFR (African American): 30 — ABNORMAL LOW
Glucose: 212 mg/dL — ABNORMAL HIGH (ref 65–99)
Osmolality: 295 (ref 275–301)
Potassium: 4.1 mmol/L (ref 3.5–5.1)
Sodium: 140 mmol/L (ref 136–145)

## 2013-07-08 LAB — CBC WITH DIFFERENTIAL/PLATELET
Eosinophil #: 0.2 10*3/uL (ref 0.0–0.7)
Eosinophil %: 2.6 %
HGB: 9.8 g/dL — ABNORMAL LOW (ref 12.0–16.0)
Lymphocyte #: 0.9 10*3/uL — ABNORMAL LOW (ref 1.0–3.6)
MCH: 29.7 pg (ref 26.0–34.0)
MCHC: 32.5 g/dL (ref 32.0–36.0)
Monocyte #: 0.7 x10 3/mm (ref 0.2–0.9)
Neutrophil %: 71.3 %
Platelet: 214 10*3/uL (ref 150–440)
RBC: 3.3 10*6/uL — ABNORMAL LOW (ref 3.80–5.20)
RDW: 15.6 % — ABNORMAL HIGH (ref 11.5–14.5)
WBC: 6.4 10*3/uL (ref 3.6–11.0)

## 2013-07-08 LAB — CK TOTAL AND CKMB (NOT AT ARMC): CK, Total: 68 U/L (ref 21–215)

## 2013-07-08 LAB — HEPATIC FUNCTION PANEL A (ARMC)
Albumin: 2.8 g/dL — ABNORMAL LOW (ref 3.4–5.0)
Bilirubin, Direct: 0.1 mg/dL (ref 0.00–0.20)
Bilirubin,Total: 0.3 mg/dL (ref 0.2–1.0)
SGOT(AST): 18 U/L (ref 15–37)
SGPT (ALT): 9 U/L — ABNORMAL LOW (ref 12–78)
Total Protein: 7 g/dL (ref 6.4–8.2)

## 2013-07-08 LAB — APTT: Activated PTT: 42.8 secs — ABNORMAL HIGH (ref 23.6–35.9)

## 2013-07-08 LAB — PROTIME-INR: INR: 1.3

## 2013-07-09 LAB — COMPREHENSIVE METABOLIC PANEL
Alkaline Phosphatase: 76 U/L (ref 50–136)
Anion Gap: 9 (ref 7–16)
Bilirubin,Total: 0.3 mg/dL (ref 0.2–1.0)
Chloride: 106 mmol/L (ref 98–107)
Creatinine: 1.85 mg/dL — ABNORMAL HIGH (ref 0.60–1.30)
EGFR (African American): 30 — ABNORMAL LOW
EGFR (Non-African Amer.): 26 — ABNORMAL LOW
Glucose: 167 mg/dL — ABNORMAL HIGH (ref 65–99)
Osmolality: 294 (ref 275–301)
Potassium: 3.7 mmol/L (ref 3.5–5.1)
SGOT(AST): 14 U/L — ABNORMAL LOW (ref 15–37)
SGPT (ALT): 7 U/L — ABNORMAL LOW (ref 12–78)
Sodium: 141 mmol/L (ref 136–145)

## 2013-07-09 LAB — CBC WITH DIFFERENTIAL/PLATELET
Basophil %: 1.4 %
Eosinophil #: 0.1 10*3/uL (ref 0.0–0.7)
Eosinophil %: 2.6 %
Lymphocyte #: 1 10*3/uL (ref 1.0–3.6)
Lymphocyte %: 18.5 %
MCH: 30.2 pg (ref 26.0–34.0)
MCV: 90 fL (ref 80–100)
Monocyte %: 12 %
Neutrophil #: 3.5 10*3/uL (ref 1.4–6.5)
Neutrophil %: 65.5 %

## 2013-07-09 LAB — CK TOTAL AND CKMB (NOT AT ARMC)
CK, Total: 54 U/L (ref 21–215)
CK-MB: 1 ng/mL (ref 0.5–3.6)

## 2013-07-09 LAB — TROPONIN I
Troponin-I: 0.03 ng/mL
Troponin-I: 0.03 ng/mL

## 2013-07-10 LAB — BASIC METABOLIC PANEL
BUN: 40 mg/dL — ABNORMAL HIGH (ref 7–18)
Chloride: 107 mmol/L (ref 98–107)
Creatinine: 2.07 mg/dL — ABNORMAL HIGH (ref 0.60–1.30)
EGFR (African American): 26 — ABNORMAL LOW
Glucose: 73 mg/dL (ref 65–99)
Osmolality: 290 (ref 275–301)

## 2013-07-10 LAB — APTT: Activated PTT: 41.7 secs — ABNORMAL HIGH (ref 23.6–35.9)

## 2013-07-11 LAB — CBC WITH DIFFERENTIAL/PLATELET
Eosinophil #: 0.2 10*3/uL (ref 0.0–0.7)
Eosinophil %: 2.9 %
HGB: 8.9 g/dL — ABNORMAL LOW (ref 12.0–16.0)
Lymphocyte #: 0.9 10*3/uL — ABNORMAL LOW (ref 1.0–3.6)
MCHC: 33.4 g/dL (ref 32.0–36.0)
MCV: 92 fL (ref 80–100)
Neutrophil #: 4 10*3/uL (ref 1.4–6.5)
Platelet: 202 10*3/uL (ref 150–440)
RBC: 2.93 10*6/uL — ABNORMAL LOW (ref 3.80–5.20)
WBC: 5.8 10*3/uL (ref 3.6–11.0)

## 2013-07-11 LAB — PROTIME-INR
INR: 1.3
Prothrombin Time: 16.3 secs — ABNORMAL HIGH (ref 11.5–14.7)

## 2013-07-11 LAB — PRO B NATRIURETIC PEPTIDE: B-Type Natriuretic Peptide: 27570 pg/mL — ABNORMAL HIGH (ref 0–450)

## 2013-07-11 LAB — BASIC METABOLIC PANEL
Anion Gap: 8 (ref 7–16)
BUN: 40 mg/dL — ABNORMAL HIGH (ref 7–18)
Co2: 26 mmol/L (ref 21–32)
Creatinine: 2.03 mg/dL — ABNORMAL HIGH (ref 0.60–1.30)
Glucose: 171 mg/dL — ABNORMAL HIGH (ref 65–99)
Osmolality: 295 (ref 275–301)
Sodium: 141 mmol/L (ref 136–145)

## 2013-07-12 LAB — BASIC METABOLIC PANEL
Anion Gap: 8 (ref 7–16)
BUN: 42 mg/dL — ABNORMAL HIGH (ref 7–18)
Chloride: 105 mmol/L (ref 98–107)
Co2: 26 mmol/L (ref 21–32)
Creatinine: 2.03 mg/dL — ABNORMAL HIGH (ref 0.60–1.30)
EGFR (African American): 27 — ABNORMAL LOW
EGFR (Non-African Amer.): 23 — ABNORMAL LOW
Potassium: 3.6 mmol/L (ref 3.5–5.1)

## 2013-07-12 LAB — APTT: Activated PTT: 43.1 secs — ABNORMAL HIGH (ref 23.6–35.9)

## 2013-07-13 ENCOUNTER — Ambulatory Visit: Payer: Self-pay | Admitting: Internal Medicine

## 2013-07-13 LAB — CBC WITH DIFFERENTIAL/PLATELET
Basophil #: 0 10*3/uL (ref 0.0–0.1)
Basophil %: 0.4 %
Eosinophil #: 0.1 10*3/uL (ref 0.0–0.7)
Lymphocyte #: 1 10*3/uL (ref 1.0–3.6)
MCV: 91 fL (ref 80–100)
Monocyte %: 10.6 %
Neutrophil #: 5.2 10*3/uL (ref 1.4–6.5)
Platelet: 203 10*3/uL (ref 150–440)
RBC: 3.05 10*6/uL — ABNORMAL LOW (ref 3.80–5.20)
WBC: 7 10*3/uL (ref 3.6–11.0)

## 2013-07-13 LAB — BASIC METABOLIC PANEL
Anion Gap: 8 (ref 7–16)
Calcium, Total: 9 mg/dL (ref 8.5–10.1)
Chloride: 103 mmol/L (ref 98–107)
Co2: 27 mmol/L (ref 21–32)
Creatinine: 1.95 mg/dL — ABNORMAL HIGH (ref 0.60–1.30)
Glucose: 182 mg/dL — ABNORMAL HIGH (ref 65–99)
Osmolality: 291 (ref 275–301)
Potassium: 3.5 mmol/L (ref 3.5–5.1)
Sodium: 138 mmol/L (ref 136–145)

## 2013-07-13 LAB — PROTIME-INR
INR: 1.3
Prothrombin Time: 16 secs — ABNORMAL HIGH (ref 11.5–14.7)

## 2013-07-14 LAB — COMPREHENSIVE METABOLIC PANEL
Bilirubin,Total: 0.5 mg/dL (ref 0.2–1.0)
Calcium, Total: 8.8 mg/dL (ref 8.5–10.1)
Creatinine: 1.85 mg/dL — ABNORMAL HIGH (ref 0.60–1.30)
EGFR (African American): 30 — ABNORMAL LOW
EGFR (Non-African Amer.): 26 — ABNORMAL LOW
Glucose: 106 mg/dL — ABNORMAL HIGH (ref 65–99)
Osmolality: 291 (ref 275–301)
Potassium: 3.5 mmol/L (ref 3.5–5.1)
SGOT(AST): 14 U/L — ABNORMAL LOW (ref 15–37)
SGPT (ALT): <6 U/L — ABNORMAL LOW (ref 12–78)

## 2013-07-14 LAB — URINALYSIS, COMPLETE
Hyaline Cast: 48
Ketone: NEGATIVE
Nitrite: NEGATIVE
Ph: 5 (ref 4.5–8.0)
Protein: 100
Specific Gravity: 1.011 (ref 1.003–1.030)
Squamous Epithelial: 5
Transitional Epi: 4
WBC UR: 84 /HPF (ref 0–5)

## 2013-07-14 LAB — APTT: Activated PTT: 46.6 secs — ABNORMAL HIGH (ref 23.6–35.9)

## 2013-07-14 LAB — PROTIME-INR
INR: 1.4
Prothrombin Time: 17.1 secs — ABNORMAL HIGH (ref 11.5–14.7)

## 2013-07-15 LAB — BASIC METABOLIC PANEL
Anion Gap: 9 (ref 7–16)
BUN: 51 mg/dL — ABNORMAL HIGH (ref 7–18)
Calcium, Total: 8.9 mg/dL (ref 8.5–10.1)
Chloride: 104 mmol/L (ref 98–107)
EGFR (Non-African Amer.): 24 — ABNORMAL LOW
Osmolality: 292 (ref 275–301)

## 2013-07-15 LAB — CBC WITH DIFFERENTIAL/PLATELET
Basophil #: 0 10*3/uL (ref 0.0–0.1)
HCT: 27 % — ABNORMAL LOW (ref 35.0–47.0)
Lymphocyte %: 17.3 %
MCH: 31.1 pg (ref 26.0–34.0)
MCHC: 34.1 g/dL (ref 32.0–36.0)
MCV: 91 fL (ref 80–100)
Monocyte #: 0.7 x10 3/mm (ref 0.2–0.9)
Neutrophil %: 67.5 %
RBC: 2.95 10*6/uL — ABNORMAL LOW (ref 3.80–5.20)
WBC: 6.4 10*3/uL (ref 3.6–11.0)

## 2013-07-16 LAB — BODY FLUID CELL COUNT WITH DIFFERENTIAL
Basophil: 0 %
Eosinophil: 0 %
Lymphocytes: 35 %
Neutrophils: 32 %
Other Cells BF: 0 %
Other Mononuclear Cells: 33 %

## 2013-07-16 LAB — PROTIME-INR: INR: 1.5

## 2013-07-16 LAB — LACTATE DEHYDROGENASE, PLEURAL OR PERITONEAL FLUID: LDH, Body Fluid: 105 U/L

## 2013-07-16 LAB — ALBUMIN, FLUID (OTHER): Body Fluid Albumin: 2.3 g/dL

## 2013-07-16 LAB — IRON AND TIBC
Iron Saturation: 16 %
Unbound Iron-Bind.Cap.: 163 ug/dL

## 2013-07-16 LAB — URINE CULTURE

## 2013-07-16 IMAGING — CT CT ABD-PELV W/ CM
1 of 2 series · 14 of 32 positions shown, 18 images · non-contrast
Comparison: none

REASON FOR EXAM: Lower Pelvic Pain Left Leg Swelling
COMMENTS:

[Series 2: abd 3mm w 3.0 i40f 3 · axial · 0.78mm/px · z∈[-70,+290]mm · 14 of 135 slices shown, 18 images]
[im 10/135  soft-tissue]
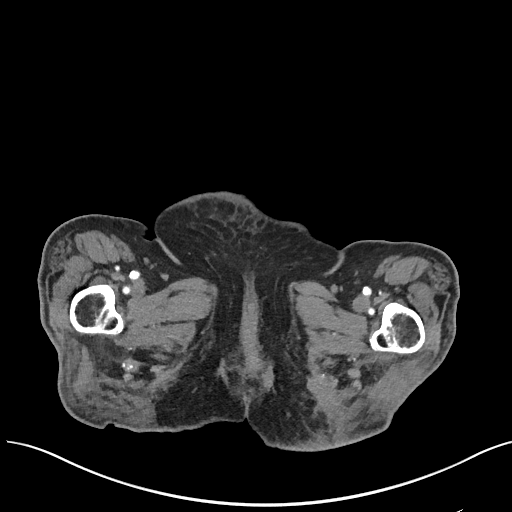
[im 10/135  bone]
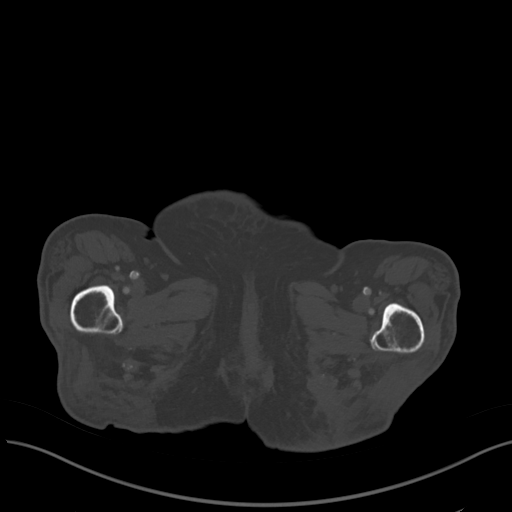
[im 20/135  soft-tissue]
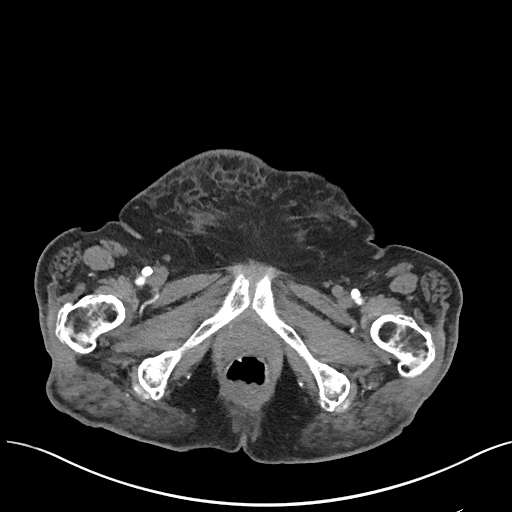
[im 30/135  soft-tissue]
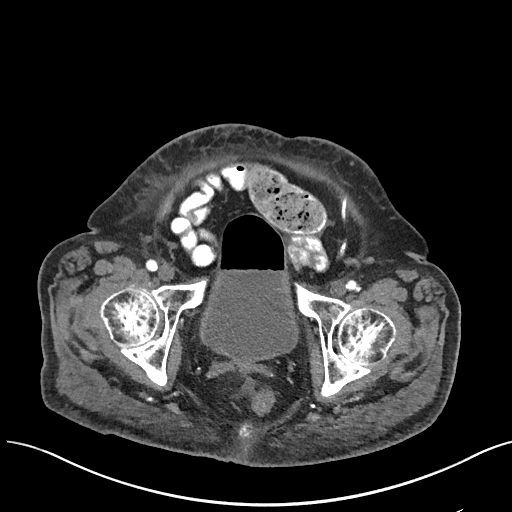
[im 40/135  soft-tissue]
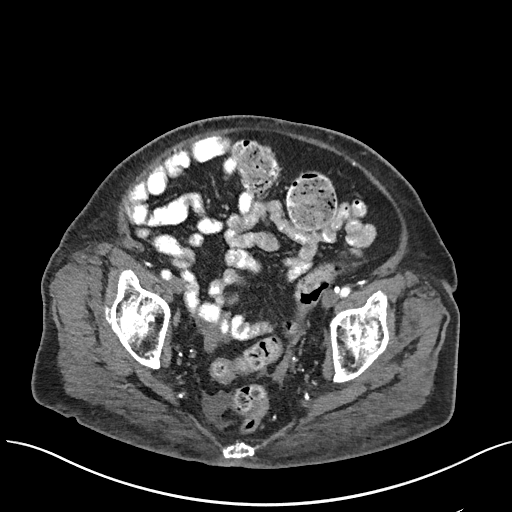
[im 50/135  soft-tissue]
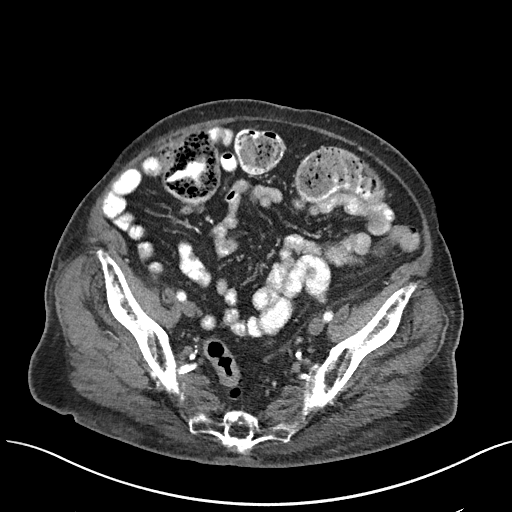
[im 60/135  soft-tissue]
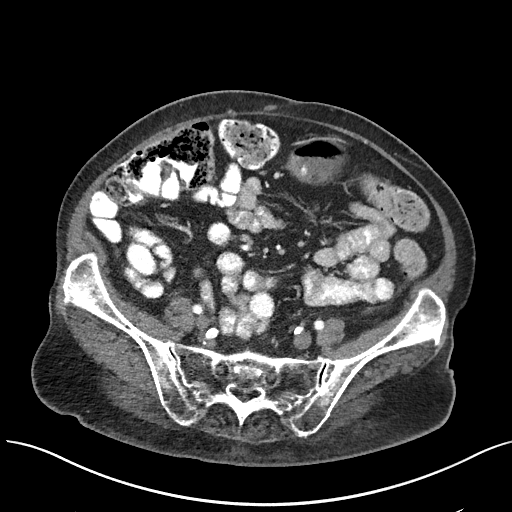
[im 75/135  soft-tissue]
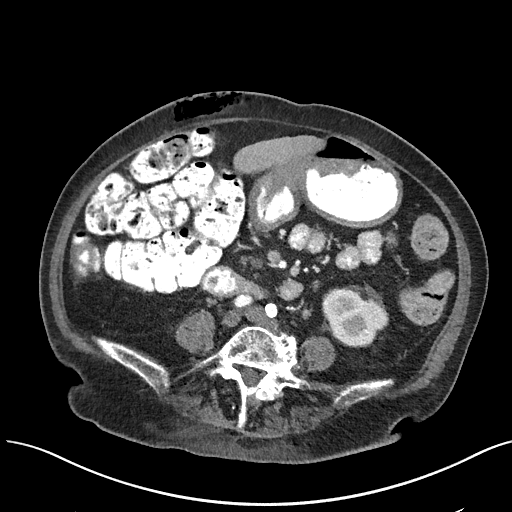
[im 85/135  soft-tissue]
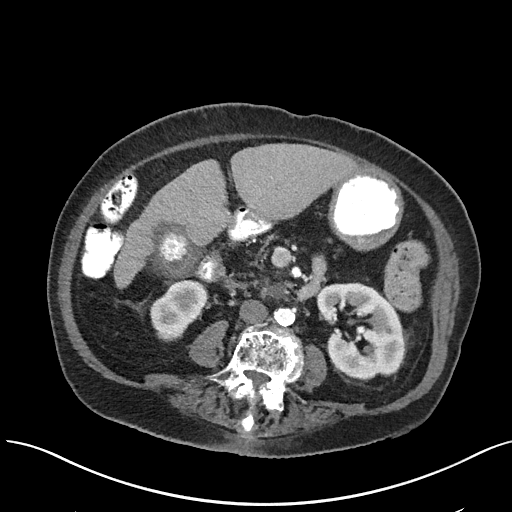
[im 95/135  soft-tissue]
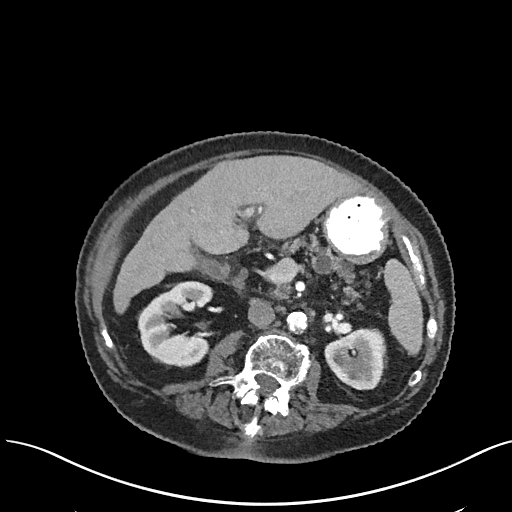
[im 95/135  bone]
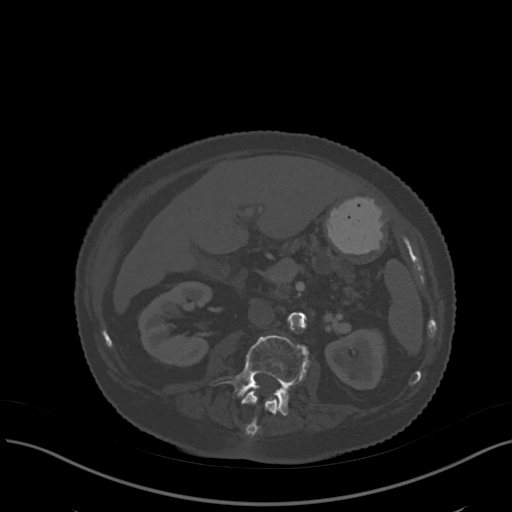
[im 105/135  soft-tissue]
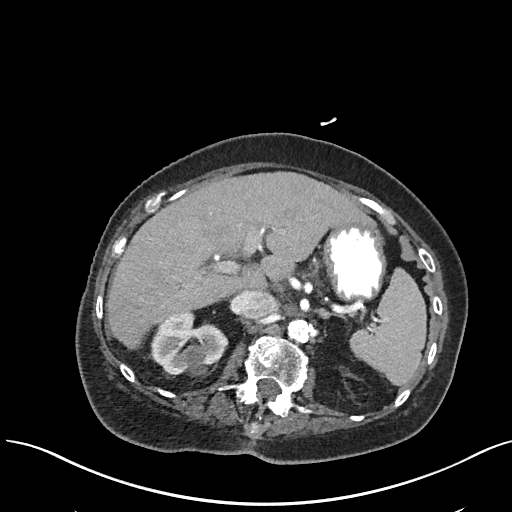
[im 115/135  soft-tissue]
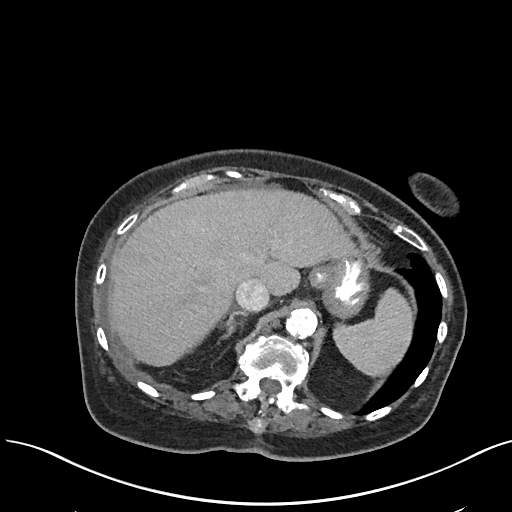
[im 115/135  lung]
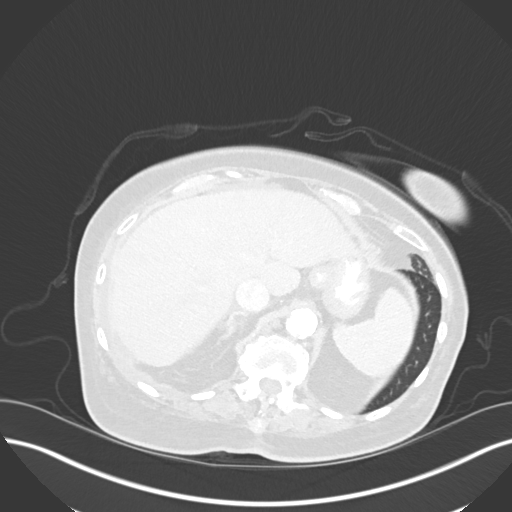
[im 120/135  lung]
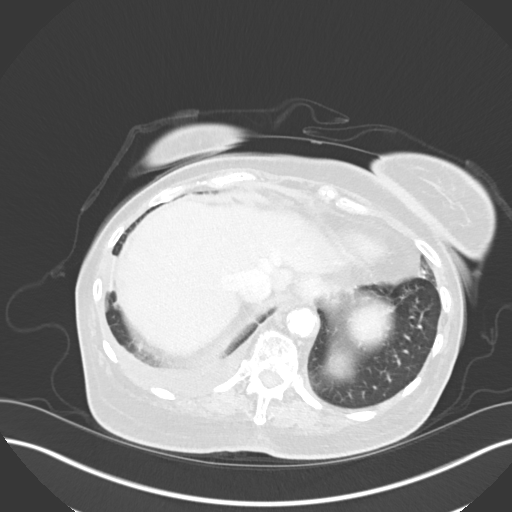
[im 125/135  soft-tissue]
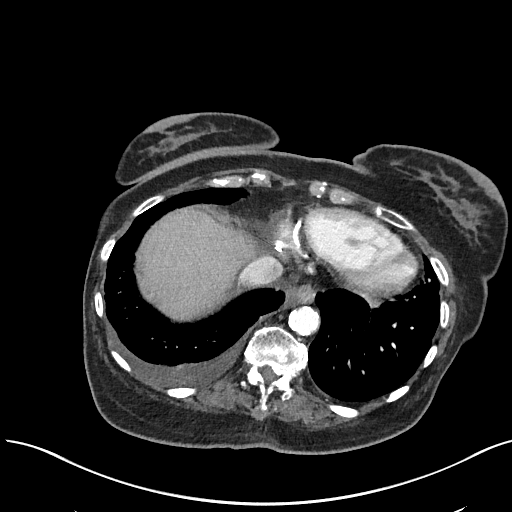
[im 125/135  lung]
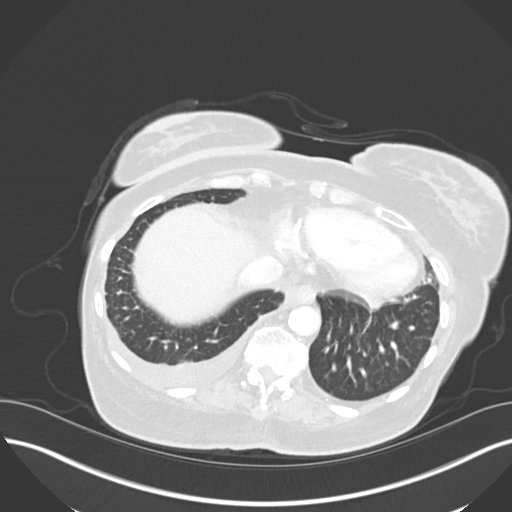
[im 130/135  lung]
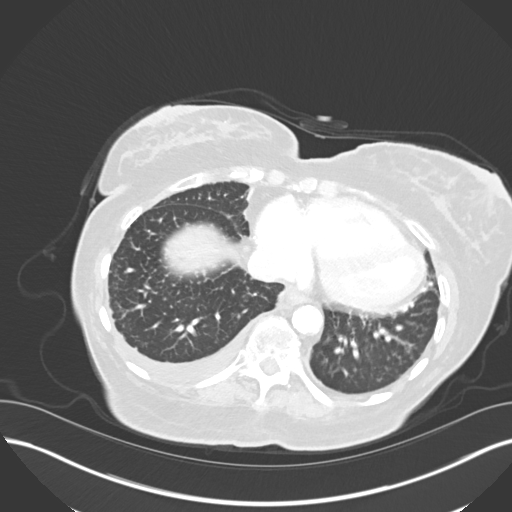

[14 of 32 positions shown; findings below may reference images not displayed]

PROCEDURE:     KCT - KCT ABDOMEN/PELVIS W  - February 24, 2012 [DATE]

RESULT:     Axial CT scanning was performed through the abdomen and pelvis
with reconstructions at 3 mm intervals and slice thicknesses following
intravenous administration of 75 cc of Isovue 300. The patient also received
oral contrast material. Comparison is made to a study dated 26 August, 2009. Review of multiplanar reconstructed images was performed separately on
the VIA monitor.

The liver exhibits no focal mass nor ductal dilation. The gallbladder is
adequately distended and contains a thickly rim calcified stone. There is
gallbladder wall thickening and a small amount of pericholecystic fluid. The
findings may reflect acute and chronic cholecystitis. The pancreas exhibits
atrophic change as well is parenchymal cystic changes which are not new. I
see no pancreatic ductal dilation. The spleen is not enlarged. The partially
distended stomach is normal in appearance. There are no adrenal masses. The
kidneys exhibit no evidence of obstruction. There is a stable cystic
appearing structure associated with the upper pole of the right kidney.

The caliber of the abdominal aorta is normal. The partially contrast-filled
loops of small and large bowel exhibit no evidence of ileus nor of
obstruction. There is a small amount of free fluid in the pelvis. The uterus
is surgically absent. The urinary bladder is partially distended and
contains considerable air. There is no evidence of an inguinal nor
significant umbilical hernia. There is a defect in the subcutaneous fat over
the right mid to lower anterior abdominal wall. I do not see evidence of
internal or external iliac nodes or periaortic or pericaval lymphadenopathy.

There is a small right pleural effusion. I see no infiltrate at the lung
bases.
IMPRESSION: 1. There is an abnormal appearance of the gallbladder. The patient has a
known gallstone. There is new wall thickening and pericholecystic fluid.
This could reflect acute and chronic cholecystitis with cholelithiasis.
2. There are chronic changes associated with the pancreas which appear
stable and may reflect the sequelae of previous pancreatitis.
3. There are cysts associated with the right kidney which appear stable.
Neither kidney exhibits evidence of obstruction or pyelonephritis.
4. I see no acute bowel abnormality. I cannot exclude constipation in the
appropriate clinical setting.
5. There is gas within the subcutaneous fat over the right anterior
abdominal wall seen on images 51 through
70. Are there clinical findings here to suggest a wound or abscess?
6. There is a small amount of free fluid in the pelvis. There is air and
fluid within the urinary bladder. Has there been recent instrumentation of
the bladder? If not, the findings may reflect infectious processes or
conceivably fistulous connection to adjacent bowel wall I favor infection.
7. There is a small right pleural effusion which is new.

## 2013-07-17 LAB — CBC WITH DIFFERENTIAL/PLATELET
Basophil #: 0.1 10*3/uL (ref 0.0–0.1)
Eosinophil %: 3.3 %
Lymphocyte %: 16.2 %
MCH: 30.8 pg (ref 26.0–34.0)
MCHC: 33.6 g/dL (ref 32.0–36.0)
MCV: 92 fL (ref 80–100)
Monocyte %: 11.2 %
Neutrophil #: 4 10*3/uL (ref 1.4–6.5)
Platelet: 190 10*3/uL (ref 150–440)
RDW: 15 % — ABNORMAL HIGH (ref 11.5–14.5)
WBC: 5.8 10*3/uL (ref 3.6–11.0)

## 2013-07-17 LAB — COMPREHENSIVE METABOLIC PANEL
Albumin: 3.3 g/dL — ABNORMAL LOW (ref 3.4–5.0)
Alkaline Phosphatase: 64 U/L (ref 50–136)
Anion Gap: 9 (ref 7–16)
Bilirubin,Total: 0.4 mg/dL (ref 0.2–1.0)
Calcium, Total: 9.1 mg/dL (ref 8.5–10.1)
Chloride: 104 mmol/L (ref 98–107)
Co2: 27 mmol/L (ref 21–32)
EGFR (African American): 26 — ABNORMAL LOW
EGFR (Non-African Amer.): 22 — ABNORMAL LOW
Osmolality: 297 (ref 275–301)
Potassium: 3.5 mmol/L (ref 3.5–5.1)
SGOT(AST): 14 U/L — ABNORMAL LOW (ref 15–37)
SGPT (ALT): <6 U/L — ABNORMAL LOW (ref 12–78)
Total Protein: 6.3 g/dL — ABNORMAL LOW (ref 6.4–8.2)

## 2013-07-17 LAB — AFP TUMOR MARKER: AFP-Tumor Marker: 1.4 ng/mL (ref 0.0–8.3)

## 2013-08-13 ENCOUNTER — Ambulatory Visit: Payer: Self-pay | Admitting: Internal Medicine

## 2013-09-12 DEATH — deceased

## 2014-04-28 IMAGING — CR DG CHEST 2V
1 series · 2 of 2 positions shown · non-contrast
Comparison: none

REASON FOR EXAM: dyspnea
COMMENTS:   May transport without cardiac monitor

PROCEDURE:     DXR - DXR CHEST PA (OR AP) AND LATERAL  - December 06, 2012  [DATE]
RESULT:     Comparison: 12/15/2011

[Series 1: w chest lat · 0.14mm/px · 2 of 2 slices shown]
[im 1/2]
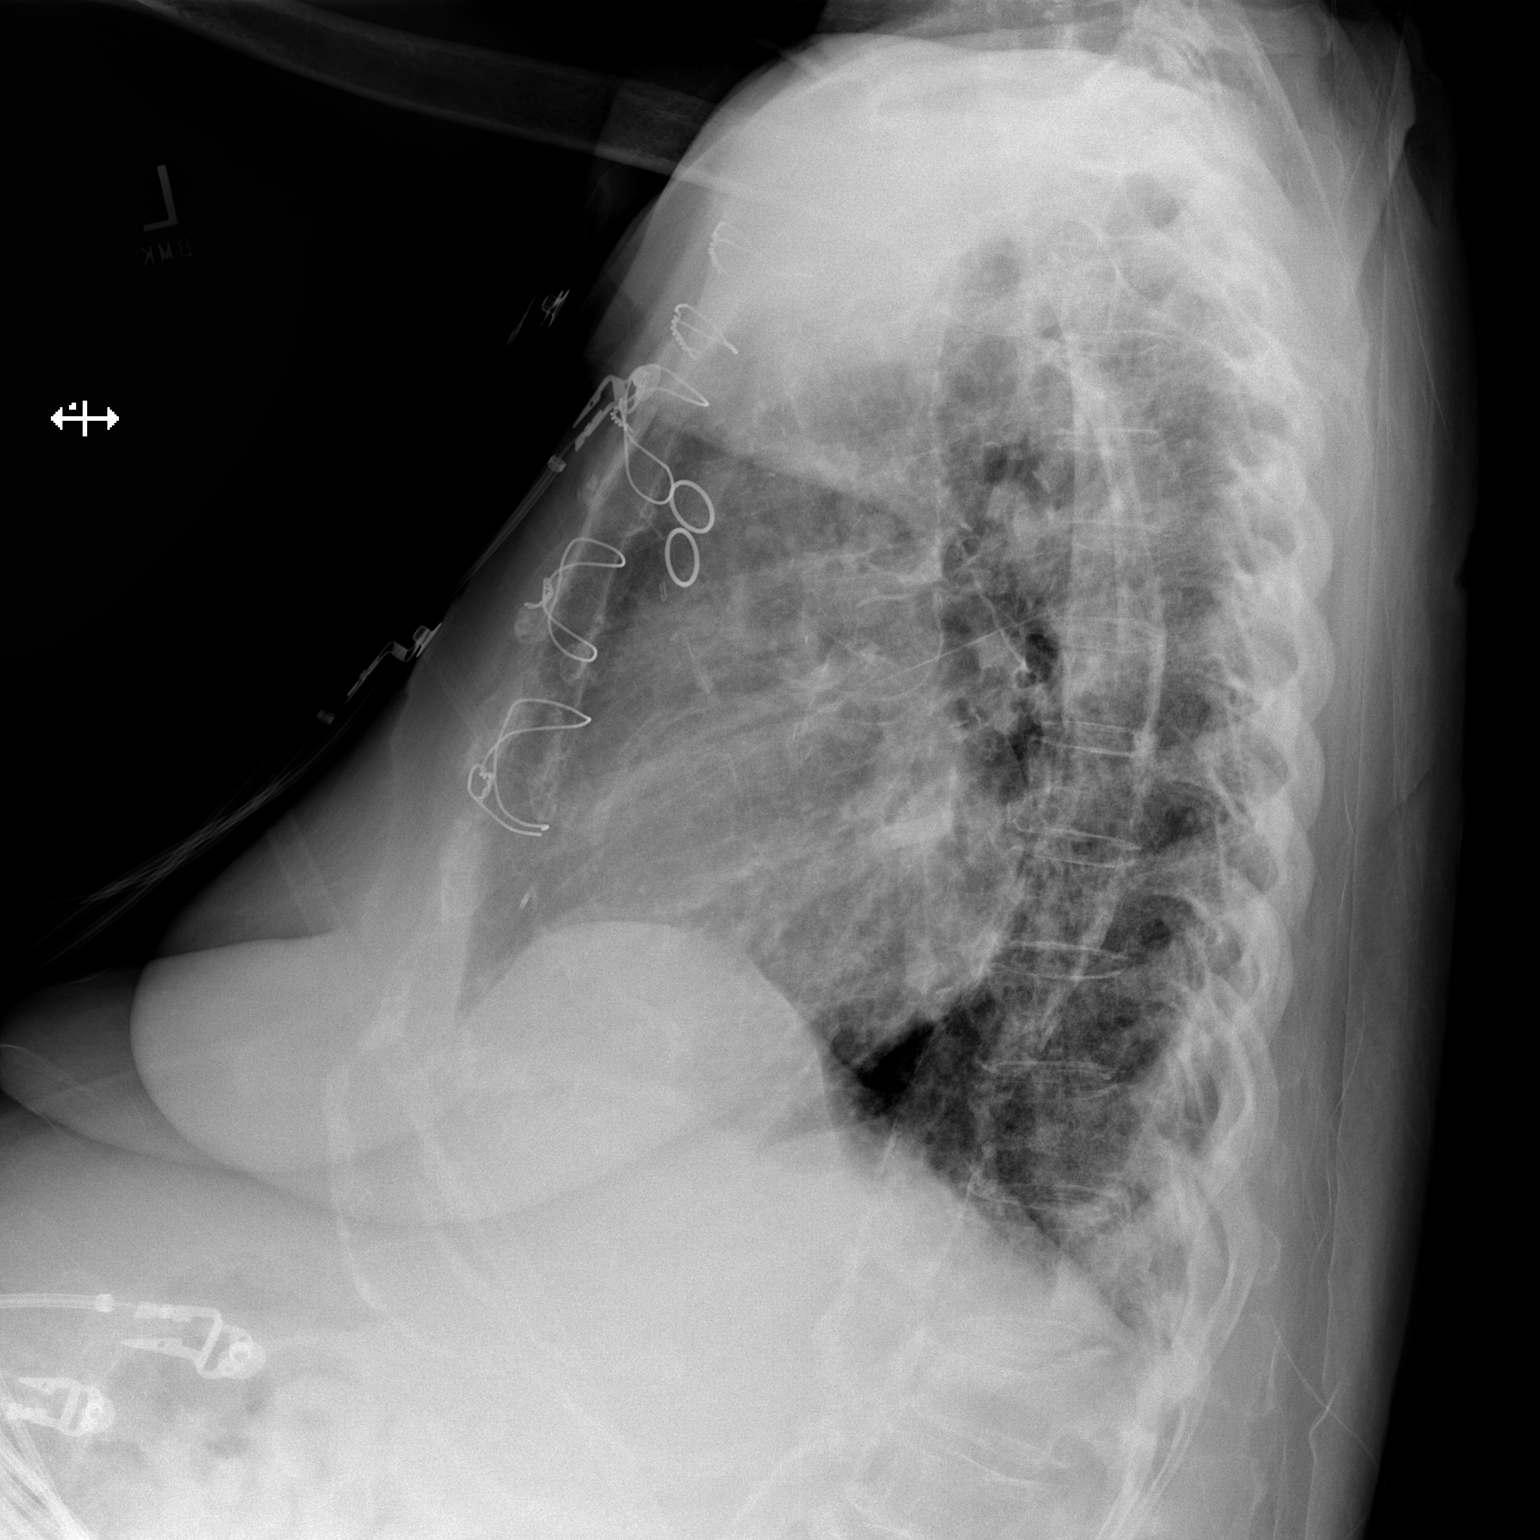
[im 2/2]
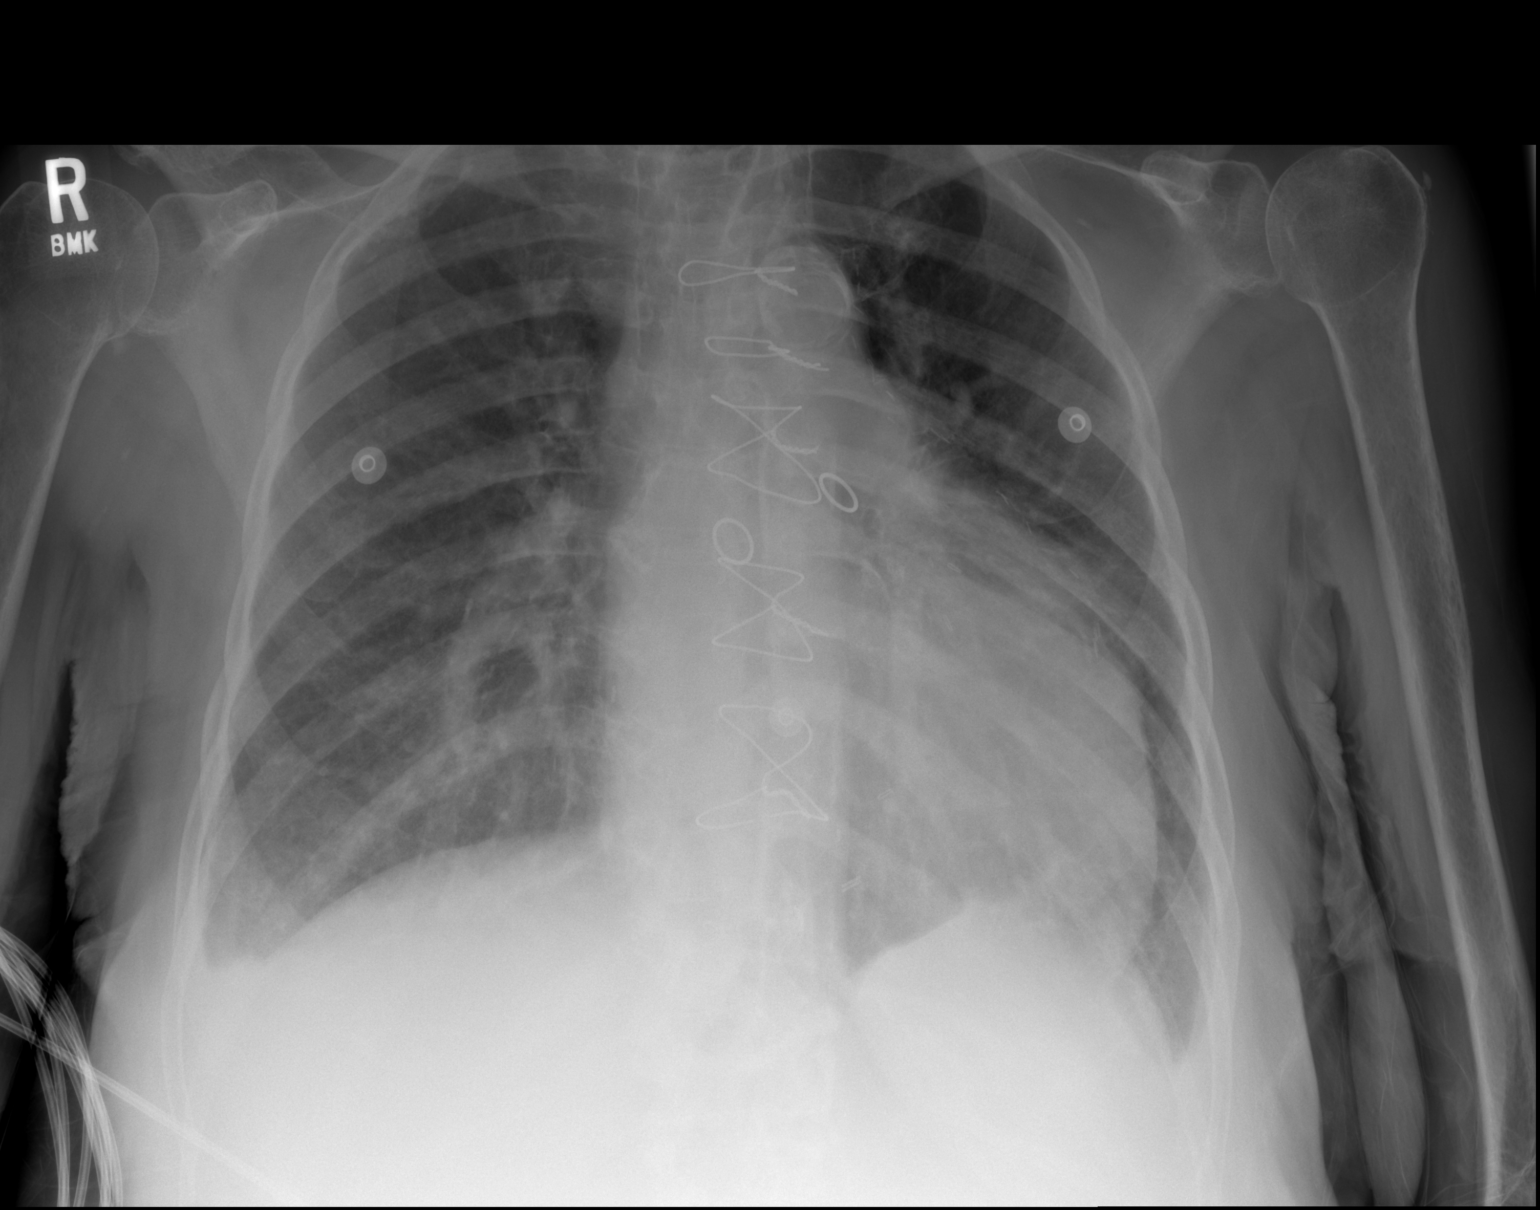

[2 of 2 positions shown; findings below may reference images not displayed]

FINDINGS: PA and lateral chest radiographs are provided. Trace bilateral pleural
effusions. There is bilateral diffuse interstitial thickening likely
representing interstitial edema versus interstitial pneumonitis secondary to
an infectious or inflammatory etiology. No pneumothorax. Prior CABG. Stable
cardiomegaly.  The osseous structures are unremarkable.
IMPRESSION: There is bilateral diffuse interstitial thickening likely representing
interstitial edema versus interstitial pneumonitis secondary to an
infectious or inflammatory etiology.

[REDACTED]

## 2015-04-01 NOTE — Consult Note (Signed)
Brief Consult Note: Diagnosis: volume overload/cad.   Patient was seen by consultant.   Recommend further assessment or treatment.   Comments: 81 yo female with history of cad s/p cabg, history of aortic valve sclerosis, history of recent admission to [facility] for partial colectomy who was recently discharged from rehab facility where she states she gained 20-30 pounds of weight felt to be volume overload. She is now admitted for treatment of chf. She is improved with diuresis. Will need to carefully follow renal funciton with diuresis. Will review records from [facility] and office and make further recs.  Electronic Signatures: Dalia HeadingFath, Meridith Romick A (MD)  (Signed 27-Dec-13 08:03)  Authored: Brief Consult Note   Last Updated: 27-Dec-13 08:03 by Dalia HeadingFath, Kathrine Rieves A (MD)

## 2015-04-04 NOTE — Consult Note (Signed)
Chief Complaint:  Subjective/Chief Complaint Pt denies any abd pain.  Atraumatic paracentesis via radiology yesterday.  Pt tolerated well.  Denies N/V.  Labs reviewed.  CA 125 elevated significantly.   VITAL SIGNS/ANCILLARY NOTES: **Vital Signs.:   05-Aug-14 08:46  Temperature Temperature (F) 98.6  Celsius 37  Pulse Pulse 69  Respirations Respirations 20  Systolic BP Systolic BP 98  Diastolic BP (mmHg) Diastolic BP (mmHg) 58  Mean BP 71  BP Source  if not from Vital Sign Device manual  Pulse Ox Activity Level  At rest  Oxygen Delivery 3L   Brief Assessment:  GEN well nourished, no acute distress, chronically ill appearing.  Pleasant.  A/Ox3   Cardiac Regular  Irregular   Respiratory normal resp effort   Gastrointestinal details normal Soft  Nontender  +mod ascites   EXTR negative cyanosis/clubbing, negative edema   Additional Physical Exam skin: warm, dry Son at bedside   Lab Results: BF Analysis:  04-Aug-14 12:24   Body Fluid Source (CC) PARACENTESIS FLUID  Body Fluid Source (CC) -  Color - (BF) YELLOW  Color - (BF) -  Clarity (BF) CLEAR  Clarity (BF) -  NCC  (nucleated cell count) 125  NCC  (nucleated cell count) -  Neutrophils  (BF) 32  Neutrophils  (BF) -  Lymphocytes  (BF) 35  Lymphocytes  (BF) -  Monocytes/Macrophages  (BF) 33  Monocytes/Macrophages  (BF) -  Eosinophil  (BF) 0  Eosinophil  (BF) -  Basophil  (BF) 0  Basophil  (BF) -  Other Cells  (BF) 0 (Result(s) reported on 16 Jul 2013 at 05:37PM.)  Other Cells  (BF) - (Result(s) reported on 16 Jul 2013 at 04:16PM.)  Albumin, Body Fluid 2.3 (Result(s) reported on 16 Jul 2013 at 05:00PM.)  Body Fluid Source PARACENTESIS FLUID  Result(s) reported on 16 Jul 2013 at 05:00PM.  LDH, BF 105  Hepatic:  05-Aug-14 04:32   Bilirubin, Total 0.4  Alkaline Phosphatase 64  SGPT (ALT)  < 6  SGOT (AST)  14  Total Protein, Serum  6.3  Albumin, Serum  3.3  Oncology:  04-Aug-14 09:04   AFP, Tumor Marker  (Serial Monitoring) 1.4 ([facility] methodology            [facility]            No: [id number]           [address], [place], [place] [address]           Lindon Romp, MD         [phone] Result(s) reported on 17 Jul 2013 at 06:48AM.)  Cancer Antigen (CA) 125  444.4 [facility] methodology            [facility]            No: [id number]           [address], [place], [place] [address]           Lindon Romp, MD         [phone] Result(s) reported on 17 Jul 2013 at 06:48AM.)  Routine Micro:  04-Aug-14 12:24   Micro Text Report MISC AER/ANAEROBIC CULT.   COMMENT                   NO GROWTH IN 8-12 HOURS   ANTIBIOTIC                       Specimen Source SOURCE  NOT INDICATED  Culture Comment NO GROWTH IN 8-12 HOURS  Result(s) reported on 17 Jul 2013 at 12:02PM.  General Ref:  04-Aug-14 09:04   Mitochondrial (M2) Antibody ========== TEST NAME ==========  ========= RESULTS =========  = REFERENCE RANGE =  MITOCHONDRIAL (M2) AB  Mitochondrial (M2) Antibody Mitochondrial (M2) Antibody     [   Result Pending       ]                                 [facility]            No: [id number]           [address] [place], [place] [address]           Nechama Guard, MD      [phone]   Result(s) reported on 17 Jul 2013 at 03:48AM.  Anti-Smooth Muscle Antibody ========== TEST NAME ==========  ========= RESULTS =========  = REFERENCE RANGE =  ANTI-SMOOTH MUSCLE ABS  Actin (Smooth Muscle) Antibody Actin (Smooth Muscle) Antibody  [   Result Pending       ]                                 [facility]                No: [id number]           [address] [place], [place] [address]           Nechama Guard, MD      [phone]   Result(s) reported on 17 Jul 2013 at 03:48AM.  Ceruloplasmin, Serum ========== TEST NAME ==========  ========= RESULTS =========  = REFERENCE RANGE  =  CERULOPLASMIN  Ceruloplasmin Ceruloplasmin                   [   23.0 mg/dL           ]         16.0-45.0               [facility]            No: [id number]           [address], [place], [place] [address]           Lindon Romp, MD         [phone]   Result(s) reported on 17 Jul 2013 at 05:48AM.  Alpha-1-Antitrypsin, Serum ========== TEST NAME ==========  ========= RESULTS =========  = REFERENCE RANGE =  ALPHA-1-ANTITRYPSIN  Alpha-1-Antitrypsin, Serum Alpha-1-Antitrypsin, Serum      [   187 mg/dL            ]            90-200               [facility]         No: [id number]           [address] [place],  [address]           Lindon Romp, MD         [phone]   Result(s) reported on 17 Jul 2013 at 05:48AM.  HBsAg ========== TEST NAME ==========  ========= RESULTS =========  = REFERENCE RANGE =  HEPATITIS B SURFACE AG  HBsAg  Screen HBsAg Screen                    [   Negative             ]          Negative               [facility]            No: [id number]           [address], [place], [place] [address]           Lindon Romp, MD         [phone]   Result(s) reported on 17 Jul 2013 at 03:50AM.  AFP, Tumor Marker ========== TEST NAME ==========  ========= RESULTS =========  = REFERENCE RANGE =  AFP, TUMOR MARKER   CA-125 ========== TEST NAME ==========  ========= RESULTS =========  = REFERENCE RANGE =  CANCER ANTIGEN 125   Immunoglobulins, Qt, IgA, IgG, IgM, Serum ========== TEST NAME ==========  ========= RESULTS =========  = REFERENCE RANGE =  PROT IMMUNOGLOBULINS  Immunoglobulins A/G/M, Qn, Ser Immunoglobulin G, Qn, Serum     [   701 mg/dL            ]          [phone] Immunoglobulin A, Qn, Serum[   330 mg/dL            ]            91-414 Immunoglobulin M, Qn, Serum     [   77 mg/dL             ]            [address]               [facility]            No: [id number]            [address], [place],  [address] Lindon Romp, MD         [phone]   Result(s) reported on 17 Jul 2013 at 06:48AM.  Routine Chem:  04-Aug-14 09:04   Iron Binding Capacity (TIBC)  195  Unbound Iron Binding Capacity 163  Iron, Serum  32  Iron Saturation 16 (Result(s) reported on 16 Jul 2013 at 09:59AM.)  Ferritin ([facility]) 86 (Result(s) reported on 16 Jul 2013 at 09:46AM.)  05-Aug-14 04:32   Glucose, Serum  113  BUN  59  Creatinine (comp)  2.10  Sodium, Serum 140  Potassium, Serum 3.5  Chloride, Serum 104  CO2, Serum 27  Calcium (Total), Serum 9.1  Osmolality (calc) 297  eGFR (African American)  26  eGFR (Non-African American)  22 (eGFR values <26m/min/1.73 m2 may be an indication of chronic kidney disease (CKD). Calculated eGFR is useful in patients with stable renal function. The eGFR calculation will not be reliable in acutely ill patients when serum creatinine is changing rapidly. It is not useful in  patients on dialysis. The eGFR calculation may not be applicable to patients at the low and high extremes of body sizes, pregnant women, and vegetarians.)  Anion Gap 9  Routine Coag:  05-Aug-14 04:32   Prothrombin  17.3  INR 1.4 (INR reference interval applies to patients on anticoagulant therapy. A single INR therapeutic range for coumarins is not optimal for all indications; however, the suggested range for most indications is 2.0 - 3.0. Exceptions to the  INR Reference Range may include: Prosthetic heart valves, acute myocardial infarction, prevention of myocardial infarction, and combinations of aspirin and anticoagulant. The need for a higher or lower target INR must be assessed individually. Reference: The Pharmacology and Management of the Vitamin K  antagonists: the seventh ACCP Conference on Antithrombotic and Thrombolytic Therapy. VZCHY.8502 Sept:126 (3suppl): N9146842. A HCT value >55% may artifactually increase the PT.  In one study,   the increase was an average of 25%. Reference:  "Effect on Routine and Special Coagulation Testing Values of Citrate Anticoagulant Adjustment in Patients with High HCT Values." American Journal of Clinical Pathology 2006;126:400-405.)  Routine Hem:  05-Aug-14 04:32   WBC (CBC) 5.8  RBC (CBC)  2.87  Hemoglobin (CBC)  8.8  Hematocrit (CBC)  26.3  Platelet Count (CBC) 190  MCV 92  MCH 30.8  MCHC 33.6  RDW  15.0  Neutrophil % 68.4  Lymphocyte % 16.2  Monocyte % 11.2  Eosinophil % 3.3  Basophil % 0.9  Neutrophil # 4.0  Lymphocyte #  0.9  Monocyte # 0.7  Eosinophil # 0.2  Basophil # 0.1 (Result(s) reported on 17 Jul 2013 at 05:53AM.)   Assessment/Plan:  Assessment/Plan:  Assessment Tense Ascites/Anasarca:  Elevated CA 125 suspicious for ovarian malignancy ?carcinomatosis.  Rapid accumulation.  Awaiting cytology. PUD: Will need FU EGD outpatient   Plan 1) FU remaining fluid studies/cytology 2) Continue supportive measures   Electronic Signatures: Andria Meuse (NP)  (Signed 05-Aug-14 12:28)  Authored: Chief Complaint, VITAL SIGNS/ANCILLARY NOTES, Brief Assessment, Lab Results, Assessment/Plan   Last Updated: 05-Aug-14 12:28 by Andria Meuse (NP)

## 2015-04-04 NOTE — Consult Note (Signed)
Chief Complaint:  Subjective/Chief Complaint The paqtient has had no further sign of bleeding or vomiting since coming to the ICU   VITAL SIGNS/ANCILLARY NOTES: **Vital Signs.:   19-Apr-14 07:00  Vital Signs Type Routine  Temperature Temperature (F) 97.5  Celsius 36.3  Temperature Source axillary  Pulse Pulse 102  Respirations Respirations 30  Systolic BP Systolic BP 242  Diastolic BP (mmHg) Diastolic BP (mmHg) 61  Mean BP 75  Pulse Ox % Pulse Ox % 95  Pulse Ox Activity Level  At rest  Oxygen Delivery Room Air/ 21 %  Pulse Ox Heart Rate 104   Brief Assessment:  Respiratory normal resp effort   Gastrointestinal Normal   Lab Results: Routine Chem:  19-Apr-14 05:30   Result Comment LABS - This specimen was collected through an   - indwelling catheter or arterial line.  - A minimum of 57ms of blood was wasted prior    - to collecting the sample.  Interpret  - results with caution.  Result(s) reported on 31 Mar 2013 at 05:44AM.  Glucose, Serum  156  BUN  71  Creatinine (comp)  1.62  Sodium, Serum  148  Potassium, Serum 4.0  Chloride, Serum  118  CO2, Serum  17  Calcium (Total), Serum 8.6  Anion Gap 13  Osmolality (calc) 318  eGFR (African American)  35  eGFR (Non-African American)  31 (eGFR values <672mmin/1.73 m2 may be an indication of chronic kidney disease (CKD). Calculated eGFR is useful in patients with stable renal function. The eGFR calculation will not be reliable in acutely ill patients when serum creatinine is changing rapidly. It is not useful in  patients on dialysis. The eGFR calculation may not be applicable to patients at the low and high extremes of body sizes, pregnant women, and vegetarians.)  Routine Hem:  19-Apr-14 05:30   WBC (CBC) 9.3  RBC (CBC)  3.17  Hemoglobin (CBC)  9.2  Hematocrit (CBC)  26.5  Platelet Count (CBC)  124  MCV 84  MCH 29.2  MCHC 34.8  RDW  18.2  Neutrophil % 76.5  Lymphocyte % 10.2  Monocyte % 11.3  Eosinophil  % 1.5  Basophil % 0.5  Neutrophil #  7.1  Lymphocyte #  0.9  Monocyte #  1.1  Eosinophil # 0.1  Basophil # 0.0   Assessment/Plan:  Assessment/Plan:  Assessment Nausea and vomiting.   Plan Patient stable. No further vomiting. No complaints. Advance diet. Hb stable.   Electronic Signatures: WoLucilla LameMD)  (Signed 19-Apr-14 09:34)  Authored: Chief Complaint, VITAL SIGNS/ANCILLARY NOTES, Brief Assessment, Lab Results, Assessment/Plan   Last Updated: 19-Apr-14 09:34 by WoLucilla LameMD)

## 2015-04-04 NOTE — Consult Note (Signed)
PATIENT NAME:  Yong ChannelYLOR, Channel T MR#:  [id number] DATE OF BIRTH:  March 22, 1934  DATE OF CONSULTATION:  03/29/2013  REFERRING PHYSICIAN:   CONSULTING PHYSICIAN:  Adella HareJ. Wilton Smith, MD  HISTORY OF PRESENT ILLNESS: This 81 year old female has a chief complaint of passing dark, bloody bowel movements. This developed since 3 p.m. yesterday. She came in emergently to the Emergency Room last night. Blood pressure was in the 60s, and she had dark blood in her bowel movement. Hemoglobin was measured at 6, and she has been admitted emergently and has received 2 blood transfusions. She reports no abdominal pain. No nausea or vomiting. Not reporting any heartburn. Has had emergency upper endoscopy done by Dr. Bluford Kaufmannh, who found a prepyloric gastric ulcer with a clot and some blood oozing out from under the clot. This was injected with epinephrine and also continued to have some oozing.   PAST MEDICAL HISTORY: Includes:  1.  Distant history of peptic ulcer disease.  2.  Does have a history of coronary artery disease and coronary artery bypass graft, which was in 1997.  3.  She has a history of chronic atrial fibrillation.  4.  History of diastolic congestive heart failure.  5.  Aortic regurgitation.  6.  Hypertension.  7.  Chronic renal insufficiency.  8.  Osteoporosis.  9.  Depression.  10. Hypertriglyceridemia.  11. Chronic venous ulcers, treated with compression therapy.   PAST SURGICAL HISTORY: Includes:  1.  Coronary artery bypass graft.  2.  Hysterectomy.  3.  She [facility], which was September 2013.  4.  Thyroidectomy.  5.  Tonsillectomy.  6.  Ganglion cyst resection.   MEDICATIONS: Include:  1.  Aspirin 81 mg daily.  2.  Zinc. 3.  Vitamin C.  4.  Ranitidine.  5.  Pravastatin.  6.  Multiple vitamins.  7.  Melatonin.  8.  Klor-Con.  9.  Insulin N 10 units b.i.d.  10. Gemfibrozil.  11. Lasix 20 mg daily.  12. Fexofenadine.  13. Feosol.  14. Cipro  eyedrops.  15. Cephalexin.  16. Calcium.  17. Buspirone.  18. Atenolol.  19. Acetaminophen.   SOCIAL HISTORY: Never drank any alcohol. Does not smoke.   FAMILY HISTORY: Positive for coronary artery disease.   REVIEW OF SYSTEMS: She reports that she is fairly inactive as far as any physical exercise. Does do some minimal amount of walking indoors. She reports her eyes recently have been matted. She reports no other hearing changes. Does tend to get short of breath. Has not had any recent chest pain. She reports no abdominal pain. No nausea or vomiting. Had been moving her bowels satisfactorily prior to yesterday. Had not noted any blood until yesterday. Has been voiding satisfactorily. Review of systems otherwise negative.   PHYSICAL EXAMINATION: GENERAL: She is awake and just slightly drowsy. She is resting in the CCU bed and just had a Foley catheter inserted. She appears to be significantly obese.  VITAL SIGNS: Blood pressure 104/39, pulse of 98, current respiratory rate 20, oxygen saturation on 2 liters is 88%. SKIN: Pale.  HEENT: Palpebral conjunctivae pale. Sclerae clear.  LUNGS: Sounds with few dependent rales.  HEART: With an irregular rhythm, S1 and S2.  ABDOMEN: Very obese. It is soft, nontender with no palpable mass. Does have significant edema of the abdominal wall extending up to the ribs. She has a Foley catheter in place.  EXTREMITIES: She has extensive edema of both lower extremities with bilateral lower extremity  wraps.  NEUROLOGIC: Awake, alert, profoundly weak but is moving all extremities.   CLINICAL DATA: BUN 86, creatinine 1.89, albumin is 2.9, initial hemoglobin 6.2 and after 2 blood transfusions this morning was 8.1, pro time is 17.8 seconds, initial platelet count 202,000, subsequently 169,000.   IMPRESSION:  1.  Prepyloric bleeding gastric ulcer.  2.  Posthemorrhagic anemia.  3.  Profound hypotension with posthemorrhagic shock.  4.  Heart failure with anasarca.    PLAN: I had discussed this with her son by phone, discussed the possibility of the bleeding resolving. I also discussed the potential need for emergency laparotomy to control bleeding. I have discussed that she appears to be at high risk for surgery.   I also discussed with Dr. Egbert Garibaldi, who will be on call until 7 p.m., who will pass information along Dr. Excell Seltzer, who will be on call during the night in case she does need to have emergency surgery.   Current plan is to give her 2 units of fresh frozen plasma and 3 units of blood. Suggest checking hemoglobin about each 6 hours.   Dr. Mordecai Maes is going to insert a central venous catheter.   ____________________________ J. Renda Rolls, MD jws:lg D: 03/29/2013 15:54:23 ET T: 03/29/2013 16:40:33 ET JOB#: [id number]  cc: Adella Hare, MD, <Dictator> Adella Hare MD ELECTRONICALLY SIGNED 03/29/2013 19:19

## 2015-04-04 NOTE — Consult Note (Signed)
CHIEF COMPLAINT and HISTORY:  Subjective/Chief Complaint Admitted with GI bleed; Consult for LE wounds, pt known to our service and wounds are followed outpatient   History of Present Illness 81 year old white female admitted for acute GI bleed with gastric ulcer and hypovolemia. We were consulted for lower extremity wounds for which we follow as an outpatient. She has been getting unna boots to bilateral lower extremities for several weeks for lower extremity edema with ulcerations. These are changed at our office weekly and by home care. She reports ulcerations have been present x 1 month and have been improving.   PAST MEDICAL/SURGICAL HISTORY:  Past Medical History:   UTI:    DM:    Anemia:    Hyperlipidemia:    HTN:    GERD:    Diabetes Type 2:    colon removal:    Hand Surgery:    Neck Surgery:    Sunis Surgery:    Back Surgery:    Partial Hysterectomy:    Adenoidectomy:    Hemorrhoidectomy:    Tonsillectomy:    CABG x 3:   ALLERGIES:  Allergies:  Benadryl: Unknown  Fosamax: Unknown  Levaquin: Unknown  Etodolac: Other  Reglan: Unknown  Colchicine: Unknown  Sulfa drugs: Unknown  HOME MEDICATIONS:  Home Medications: Medication Instructions Status  cephalexin 500 mg oral capsule 1 cap(s) orally every 8 hours x 7 days Active  ciprofloxacin 1 drop(s) to each affected eye every 6 hours Active  insulin isophane 100 units/mL human recombinant subcutaneous suspension 10 unit(s) subcutaneous 2 times a day Active  gemfibrozil 600 mg oral tablet 1 tab(s) orally 2 times a day Active  atenolol 50 mg oral tablet 1 tab(s) orally once a day Active  Feosol 325 mg (65 mg elemental iron) oral tablet 1  orally 2 times a day Active  aspirin 81 mg oral tablet, chewable 1 tab(s) orally once a day Active  melatonin 3 mg oral tablet 1 tab(s) orally once (at bedtime) Active  Vitamin C 500 mg oral tablet 1 tab(s) orally 2 times a day Active  ranitidine 150 mg oral capsule 1  cap(s) orally once a day Active  zinc sulfate 1 tab(s) orally once a day Active  pravastatin 40 mg oral tablet 1 tab(s) orally once a day (at bedtime) Active  acetaminophen 500 mg oral tablet 2 tab(s) orally every 8 hours, As Needed- for Pain  Active  busPIRone 10 mg oral tablet 1 tab(s) orally 3 times a day Active  Calcium 600+D 1 tab(s)  2 times a day Active  Multiple Vitamins with Iron oral tablet 1 tab(s) orally once a day Active  Klor-Con M20 oral tablet, extended release 0.5 tab(s) orally once a day Active  fexofenadine 180 mg oral tablet 1 tab(s) orally once a day Active  furosemide 40 mg oral tablet 0.5 tab(s) orally once a day Active   Family and Social History:  Family History Hypertension  Diabetes Mellitus   Social History negative tobacco, negative ETOH, negative Illicit drugs   Review of Systems:  Fever/Chills No   Sputum No   Abdominal Pain Yes   Diarrhea Yes   Nausea/Vomiting Yes   Chest Pain No   Dysuria No   Physical Exam:  GEN ill appearing   HEENT PERRL, NG in place   NECK supple  No masses   RESP normal resp effort  clear BS   CARD regular rate  no murmur   ABD soft  normal BS   EXTR  negative cyanosis/clubbing, Unna boots removed bilaterally, no ulcerations RLE, open blister left medial shin; mild edema   NEURO cranial nerves intact   PSYCH alert, A+O to time, place, person   LABS:  Laboratory Results: Routine Chem:    18-Apr-14 15:28, CBC Profile  Result Comment   LABS - This specimen was collected through an    - indwelling catheter or arterial line.   - A minimum of 5mls of blood was wasted prior     - to collecting the sample.  Interpret   - results with caution.   Result(s) reported on 30 Mar 2013 at 03:44PM.  Routine Coag:    18-Apr-14 06:25, Prothrombin Time  Prothrombin 20.2  INR 1.7  INR reference interval applies to patients on anticoagulant therapy.  A single INR therapeutic range for coumarins is not optimal for  all  indications; however, the suggested range for most indications is  2.0 - 3.0.  Exceptions to the INR Reference Range may include: Prosthetic heart  valves, acute myocardial infarction, prevention of myocardial  infarction, and combinations of aspirin and anticoagulant. The need  for a higher or lower target INR must be assessed individually.  Reference: The Pharmacology and Management of the Vitamin K   antagonists: the seventh ACCP Conference on Antithrombotic and  Thrombolytic Therapy. Chest.2004 Sept:126 (3suppl): [id number].  A HCT value >55% may artifactually increase the PT.  In one study,   the increase was an average of 25%.  Reference:  "Effect on Routine and Special Coagulation Testing Values  of Citrate Anticoagulant Adjustment in Patients with High HCT Values."  American Journal of Clinical Pathology 2006;126:400-405.  Routine Hem:    18-Apr-14 15:28, CBC Profile  WBC (CBC) 10.2  RBC (CBC) 3.27  Hemoglobin (CBC) 9.5  Hematocrit (CBC) 27.1  Platelet Count (CBC) 118  MCV 83  MCH 29.2  MCHC 35.2  RDW 18.1  Neutrophil % 76.1  Lymphocyte % 10.6  Monocyte % 12.1  Eosinophil % 0.6  Basophil % 0.6  Neutrophil # 7.7  Lymphocyte # 1.1  Monocyte # 1.2  Eosinophil # 0.1  Basophil # 0.1   RADIOLOGY:  Radiology Results: XRay:    17-Apr-14 16:16, Chest Portable Single View  Chest Portable Single View  REASON FOR EXAM:    central line placement  COMMENTS:       PROCEDURE: DXR - DXR PORTABLE CHEST SINGLE VIEW  - Mar 29 2013  4:16PM     RESULT: Comparison is made to the study dated 10 February 2013. Cardiomegaly   is present. CABG changes are demonstrated. A nasogastric tube passes   below the diaphragm. There is a peripherally inserted central catheter   from the left. There is mild interstitial thickening in the lung bases   which shows improved aeration compared to previous study. Evolving   pneumonia or residual atelectasis are differential considerations.    Monitoring electrodes are present.    IMPRESSION:  Improving aeration in the lung bases. Stable cardiomegaly   with CABG changes.  Dictation Site: 2        Verified By: Elveria RoyalsGEOFFREY H. BROWNE, M.D., MD  LabUnknown:  PACS Image   ASSESSMENT AND PLAN:  Assessment/Admission Diagnosis 81 year old white female admitted for acute GI bleed with gastric ulcer and hypovolemia. We were consulted for lower extremity wounds for which we follow as an outpatient. Unna boots bilaterally were removed. Has no ulceration to RLE, opened blister of the LLE. Mild edema bilaterally without signs of infection. Continue  unna boots.   Electronic Signatures: Lylah Lantis, Leisure centre manager (PA-C)  (Signed 18-Apr-14 22:12)  Authored: Chief Complaint and History, PAST MEDICAL/SURGICAL HISTORY, ALLERGIES, HOME MEDICATIONS, Family and Social History, Review of Systems, Physical Exam, LABS, RADIOLOGY, Assessment and Plan   Last Updated: 18-Apr-14 22:12 by Governor Specking (PA-C)
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
## 2015-04-04 NOTE — Discharge Summary (Signed)
PATIENT NAME:  Caitlin Caldwell, Caitlin Caldwell MR#:  [id number] DATE OF BIRTH:  1934-12-13  DATE OF ADMISSION:  02/11/2013 DATE OF DISCHARGE:  02/12/2013  For a detailed note, please see the history and physical done on admission by Dr. Amado CoeGouru.   DIAGNOSES AT DISCHARGE:   1.  Lower extremity cellulitis.  2.  Chronic venous stasis with blister formation on the lower extremities.  3.  Hypertension.  4.  Diabetes.  5.  History of congestive heart failure.  6.  Hyperlipidemia.   CODE STATUS:  The patient is a DO NOT INTUBATE/DO NOT RESUSCITATE.   DIET:  She is being discharged on a low-sodium, low fat, carb-controlled diet.   ACTIVITY:  As tolerated.   FOLLOWUP:  With Dr. Silver HugueninAileen Miller in the next 1 to 2 weeks.   DISCHARGE MEDICATIONS:  Iron sulfate 325 mg b.i.d., aspirin 81 mg daily, melatonin 3 mg at bedtime, vitamin C 500 mg b.i.d., atenolol 50 mg daily, gemfibrozil 600 mg b.i.d., ranitidine 150 mg daily, zinc sulfate daily, Pravachol 40 mg daily, Tylenol 500 mg 2 tabs q. 8 hours as needed, buspirone 10 mg Caldwell.i.d., calcium with vitamin D 1 tab b.i.d., multivitamin daily, potassium 10 mEq daily, Allegra 180 mg daily, insulin NPH 10 units b.i.d., Lasix 40 mg 1/2 tab daily, ciprofloxacin drops 1 drop to each affected eye every 6 hours and cephalexin 500 mg q. 8 hours x 7 days.   PERTINENT STUDIES DONE DURING THE HOSPITAL COURSE:  Chest x-ray done on admission showing congestive heart failure.   BRIEF HOSPITAL COURSE:  This is a 81 year old female who presented to the hospital with redness and swelling of her bilateral lower extremities.  1.  Bilateral lower extremity cellulitis. This is a clinical diagnosis based on her presenting symptoms. The patient was started on IV ceftriaxone. She had no fever and no elevated white cell count. Her redness and swelling seem to be chronic in nature and unlikely related to acute cellulitis. I did obtain a wound team consult. They saw the patient and placed the patient on  wound dressings daily. She is being discharged to home with home health services. Therefore, we will get dressing changes from the home health agency. I am discharging her on a short course of p.o. Keflex as stated. Her blood cultures have remained negative.  2.  History of congestive heart failure. The patient clinically did not present with congestive heart failure. She did have some lower extremity swelling and blistering, but that was probably related to chronic venous stasis. She will be discharged back on her maintenance medications including Lasix and atenolol.  3.  Diabetes. The patient had no evidence of any hypoglycemic episodes. She will continue her NPH insulin as stated.  4.  Hyperlipidemia. The patient will continue her Pravachol and gemfibrozil as stated.  5.  History of anxiety. The patient was continued on buspirone. She will resume that upon discharge, too.   DISPOSITION:  She is being discharged with home health, physical therapy and nursing services.   TIME SPENT:  40 minutes.    ____________________________ Rolly PancakeVivek J. Cherlynn KaiserSainani, MD vjs:si D: 02/12/2013 15:01:46 ET Caldwell: 02/12/2013 18:11:13 ET JOB#: [id number]  cc: Rolly PancakeVivek J. Cherlynn KaiserSainani, MD, <Dictator> Yetta FlockAileen H. Miller, MD Houston SirenVIVEK J SAINANI MD ELECTRONICALLY SIGNED 02/21/2013 8:12

## 2015-04-04 NOTE — Consult Note (Signed)
Brief Consult Note: Diagnosis: ascites.   Patient was seen by consultant.   Consult note dictated.   Comments: [name] is a pleasant 81 y/o female with diastolic CHF, AF, hx resp failure, CKD, & CAD who had had reported 40+ # weight gain & anasarca with 4 quadrant abdominal ascites.  No hx liver disease & normal liver on CT last year.  Hx large gastric ulcer managed medically April 2014.  Due for FU EGD to verify healing.  Differentials include congestive hepatopathy, malignancy, occult cirrhosis from other causes, renal failure, doubt perforated PUD.  Plan: She will need further imaging with either CT vs ultrasound, followed by LVAP with fluid analysis.  I will discuss her complex care with Dr Caitlin Caldwell today & make further recommendations.  She is on ASA/heparin currently.  Addendum:  I discussed her care with Dr Caitlin Caldwell, Caitlin Caldwell & Dr Caitlin Caldwell (radiologist).  Benefits outweigh risk of bleeding for ultrasound guided LVAP.  Will recheck PTT in AM, hold ASA, hold heparin with plans for LVAP with fluid studies in AM. (cytology, cell ct diff, albumin, LDH, cultures)  Thanks for consult.  Please see full dictated note.  #[id number].  Electronic Signatures: Caitlin Caldwell, Caitlin Caldwell (NP)  (Signed 29-Jul-14 14:47)  Authored: Brief Consult Note   Last Updated: 29-Jul-14 14:47 by Caitlin Caldwell, Caitlin Caldwell (NP)

## 2015-04-04 NOTE — Consult Note (Signed)
Pt seen and examined. Full consult to follow. Admitted with melena since 2 PM yest assoc with weakness/lightheadedness. Initial SBP 60's to 70's. No abd pain. Nausea. Hx of esophagitis with ulcerations in the past (2009). Hx of colon polyps but normal colon in 2010. [facility] "diseased" colon. Hx of IDDM, CHF, A.fib, HTN, etc. Leg edema. Abd distension but nontender. Immediately given 2L NS plus 2 units of uncross matched PRBC. Pt feels better but still quite pale. Initial Hgb at 6.2. UGI bleeding. Now, SBP above 140. Agree with protonix IV. NPO. Close monitering iin CCU with hgb, glucose levels. Plan EGD in AM. Cancel CT of abdomen. Will interfere with getting EGD done. Thanks.   Electronic Signatures: Lutricia Feilh, Jemina Scahill (MD) (Signed on 17-Apr-14 02:22)  Authored   Last Updated: 17-Apr-14 02:24 by Lutricia Feilh, Obaloluwa Delatte (MD)

## 2015-04-04 NOTE — H&P (Signed)
PATIENT NAME:  Caitlin Caldwell, FREDMAN MR#:  [id number] DATE OF BIRTH:  1934-04-27  DATE OF ADMISSION:  01/03/2013  PRIMARY CARE PHYSICIAN: Yetta Flock, MD  HISTORY OF PRESENT ILLNESS: The patient is a 81 year old Caucasian female with past medical history significant for history of a recent admission in January 2014 for acute on chronic renal failure, CHF, was sent to rehab at [facility] on 12/15/2012. Comes back to the hospital from home this time with being very groggy and confused. Apparently the patient was given her usual doses of insulin, which are much higher than what the patient was discharged on to rehab on 12/15/2012. She was given much higher doses and blood glucose levels were found to be only 25 today when EMS arrived to home where the patient was unresponsive. The patient was given D50, 1 amp, and her blood glucose level improved to 190 and her alertness also improved. However, on arrival to Emergency Room, her blood glucose level was reported around 17, so she was given more glucose and she is hooked up to D10 water solution now and her blood glucose levels are around 200. Hospitalist services were contacted for admission, as the patient was also noted to have pneumonia.   PAST MEDICAL HISTORY: Significant for recent admission in December to January 2014 for acute on chronic renal failure. During the same time, the patient had acute on chronic diastolic CHF, E. coli UTI, also diabetes mellitus, osteoporosis, iron deficiency anemia, depression, chronic atrial fibrillation, history of deconditioning, coronary artery disease, status post coronary artery bypass grafting in 1997, history of hypertension, osteoporosis, degenerative joint disease, B12 as well as iron deficiency anemia, valvular heart disease. Echocardiogram done in May 2010 revealed aortic valve sclerosis, as well as ejection fraction of 55% and moderate mitral valve stenosis. Echocardiogram repeated on the most recent  admission in December 2013 revealed borderline left atrial enlargement, mild mitral regurgitation, severe tricuspid regurgitation. The right ventricle was moderately dilated. There was moderate concentric left ventricular hypertrophy noted. Ejection fraction of 50% to 55%. Left ventricular systolic function low-normal. Right atrium was moderately dilated. Mild aortic regurgitation. Right ventricular systolic pressure was elevated at more than 60 mmHg. Moderate valvular aortic stenosis was also noted. Past medical history is also significant for hypertriglyceridemia, gallstones, history of esophagitis and ulceration per EGD in 2009, history of A. fib, has been not on Coumadin but takes aspirin therapy.   PAST SURGICAL HISTORY: Coronary artery bypass grafting, partial colectomy of approximately 12 inches of colon removed for diverticulitis at [facility] in September 2013, herniated disk, thyroidectomy, tonsillectomy, ganglion cyst resection from the right hand, also hysterectomy and fibroid resection in 1971.   ALLERGIES: BENADRYL, FOSAMAX, LEVAQUIN AS WELL AS LODINE.   MEDICATIONS: According to medical records, the patient was discharged from our hospital on 12/15/2012 on only NovoLog FlexPen 50 units 3 times daily before meals as well as Amaryl 4 mg p.o. twice daily. She restarted her usual doses of medications when she came back home from rehab approximately a week ago. She apparently has been using not only glimepiride of above-mentioned dose, but also insulin N. She uses insulin N at 30 to 36 units subcutaneously twice a day as well as Humalog 30 units 3 times daily with meals. She is also on acetaminophen 500 mg 2 tablets every 8 hours as needed, aspirin 81 mg p.o. daily, atenolol 50 mg p.o. daily, buspirone 10 mg p.o. 3 times daily, calcium 600 with vitamin D 1 tablet twice a  day, Feosol 325 mg p.o. twice a day, fexofenadine 180 mg p.o. daily, furosemide 40 mg p.o. once daily, gemfibrozil 600 mg p.o. twice a  day, glimepiride 4 mg p.o. twice a day, Humalog 30 units 3 times daily, Humulin N 30 to 36 units subcutaneously twice daily, Klor-Con 1-1/2 tablets of 20 mEq p.o. daily dose (once daily dose), melatonin 3 mg p.o. at bedtime, multivitamins with iron once a day, pravastatin 40 mg p.o. daily, ranitidine 150 mg p.o. daily, vitamin C 500 mg p.o. twice a day and zinc sulfate 1 tablet once a day.   SOCIAL HISTORY: Lives at home with son. No smoking, alcohol or illicit drug abuse.  FAMILY HISTORY: Coronary artery disease. Father had coronary artery disease at the age of 46. The patient's mother had myocardial infarction x 2 at an unknown age.   REVIEW OF SYSTEMS: The patient admits of having some weight gain, approximately 35 pounds since September 2013 colon surgery, due to fluid retention. Admits of having no fevers. Admits of having fatigue and weakness which seems to be worsening over a period of a weeks now, and especially bad over the past 1 week since she is home and she has been using her usual doses of insulin. She admits of having very low glucose levels at home. Admits of having some blurring of vision, some cough as well as minimal sputum production, some shortness of breath and sinus congestion, also lower extremity edema which seems to be chronic and worsening over a period of time, intermittent arrhythmias (she feels that she is in atrial fibrillation), also constipation and arm arthritis. She also admits of having significant weakness, unsteadiness on her feet as well as dizziness, especially for the past 2 weeks while she was in rehab and was not able to do much with rehabilitation with learning how to use a rolling walker. Otherwise denies any significant pains, weight loss or gain.  EYES: Denies any blurry vision, double vision, glaucoma or cataracts.  ENT: Denies any tinnitus, allergies, epistaxis, sinus pain, dentures or difficulty swallowing. RESPIRATORY: Denies any wheezes, asthma, COPD.   CARDIOVASCULAR: Denies any chest pains, orthopnea, palpitations or syncopal episodes. GASTROINTESTINAL: No nausea, vomiting, diarrhea, rectal bleeding, change in bowel habits. GENITOURINARY: Denies dysuria, hematuria, frequency, incontinence. ENDOCRINE: Denies any polydipsia, nocturia, thyroid problems, heat or cold intolerance or thirst. HEMATOLOGIC: Denies anemia, easy bruising, bleeding, swollen glands. SKIN: Denies acne, rashes, lesions or change in moles.  MUSCULOSKELETAL: Denies arthritis, cramps, swelling, gout.  NEUROLOGICAL: No numbness, epilepsy or tremors. PSYCHIATRIC: Denies anxiety, insomnia, depression.   PHYSICAL EXAMINATION:  VITAL SIGNS: On arrival to the hospital, the patient's temperature is 98.7, pulse 99, respiration rate 18, blood pressure 176/84, saturation 95% on room air. GENERAL: This is a well-developed, well-nourished Caucasian female, very pale and very weak, lying on the stretcher. She is using mask to breathe on her face. HEENT: Her pupils are equal and reactive to light. Extraocular movements intact. No icterus. The patient does have, however, conjunctivitis. A significant amount of pus is caked to the eyelids bilaterally, especially on the right side. Even difficult to open her right eye. She has normal hearing. No pharyngeal erythema. Mucosa is dry.  NECK: No masses. Supple, nontender. Thyroid is not enlarged. No adenopathy. No JVD or carotid bruits bilaterally. Full range of motion.  LUNGS: Diminished breath sounds bilaterally. A few rhonchi were heard, otherwise no significant rales or wheezing. Nonlabored inspirations. Some increased effort due to wheeze. Not in overt respiratory distress, however, using nonrebreather  to maintain her oxygenation. Unfortunately, her extremities are cool and not able to pick up her pulse oximetry at this point.  CARDIOVASCULAR: S1, S2 appreciated. Rhythm is irregularly irregular. The patient does have a few murmurs precordially in  aortic auscultation side as well as in intercostal auscultation side. Murmurs seem to be radiating to her neck. PMI not lateralized. Chest is nontender to palpation.  EXTREMITIES: 1+ pedal pulses, 1 to 2+ lower extremity edema was noted. Skin breakage was noted in the right lower extremity lower anterior shin. No calf tenderness or cyanosis was noted. The patient's extremities, however, are very cold to palpation. Capillary refill is satisfactory. ABDOMEN: Nontender, moderately firm, protuberant. No hepatosplenomegaly or masses were noted.  RECTAL: Deferred.  MUSCULOSKELETAL: Muscle strength: Able to move all extremities. No cyanosis, degenerative joint disease or kyphosis. Gait is not tested.  SKIN: No rashes, lesions, erythema, nodularity or induration except in the lower extremities. Otherwise was warm and dry to palpation except within acral areas of lower extremities as well as upper extremities.  LYMPH: No adenopathy in the cervical region.  NEUROLOGICAL: Cranial nerves grossly intact. Sensory is intact. No dysarthria or aphasia.  PSYCHIATRIC: The patient is alert, oriented to time, person and place, cooperative. Memory is good. No significant confusion, agitation or depression.   LABORATORY AND DIAGNOSTIC DATA: EKG showed A. fib with rate of 73 beats per minute, right axis deviation, septal infarct age indeterminate; nonspecific ST-T changes were noted. Lab data done on 01/03/2013 showed BUN and creatinine of 39 and 1.46; glucose was 17 at that moment. The patient's estimated GFR for non-African American would be 35. Liver enzymes were normal. Cardiac enzymes: First set negative. TSH normal at 2.66. White blood cell count was normal at 8.6, hemoglobin 11.1, platelet count 241. Urinalysis: Yellow clear urine, negative for glucose, bilirubin or ketones, specific gravity 1.009, pH 5.0, negative for blood, 30 mg/dL protein, negative for nitrites or leukocyte esterase, 7 red blood cells, less than 1  white blood cell, no bacteria; less than 1 epithelial cell was noted.   RADIOLOGIC STUDIES: Chest x-ray portable single view on 01/03/2013 showed heterogeneous asymmetric basilar opacities, which may be secondary to infection or aspiration. Followup PA and lateral x-rays were recommended. Interstitial opacities suggesting interstitial edema were also noted. CT of head without contrast on 01/03/2013 revealed atrophy as well as no acute intracranial abnormalities. Chronic microvascular ischemic disease according to radiologist was present.   ASSESSMENT AND PLAN:  1.  Hypoglycemia in diabetic with coma: Admit patient to medical floor. Continue her on D10 solution. Will follow the patient's glucose levels and  wean off D10 as possible. The patient was discharged previously on glimepiride, as mentioned above, as well as lower doses of NovoLog, however, resumed her usual doses including higher doses of NovoLog as well as NPH when she was discharged from rehabilitation. Will resume, however, patient's medications whenever she is able to eat.  2.  Encephalopathy, metabolic: CT of head was negative, but atrophy was noted. The patient's symptoms seem to be improving, very likely blood glucose related. Will continue neuro checks.  3.  Acute respiratory failure due to pneumonia, questionable aspiration as well as congestive heart failure (left heart, acute on chronic diastolic): Will continue oxygen as needed, keeping pulse oximetries around 92% and above.  4.  Questionable pneumonia, questionable aspiration due to altered mental status: Will get speech therapy evaluation, as well as will start the patient on vancomycin and Zosyn, as the patient was in rehab facility  recently. Will get also sputum cultures.  5.  Conjunctivitis: Will initiate ciprofloxacin drops.  6.  Atrial fibrillation, rate controlled: Will continue home medications, provided patient is able to take orally.  7.  Lower extremity swelling due to  congestive heart failure (right heart, acute on chronic diastolic): Will continue patient on Lasix intravenously, as well as Unna boots will be placed.   TIME SPENT: One hour.   ____________________________ Katharina Caperima Jayonna Meyering, MD rv:jm D: 01/03/2013 16:26:42 ET T: 01/03/2013 17:47:00 ET JOB#: [id number]  cc: Katharina Caperima Rosali Augello, MD, <Dictator> Yetta FlockAileen H. Miller, MD Simren Popson MD ELECTRONICALLY SIGNED 02/01/2013 17:12

## 2015-04-04 NOTE — Consult Note (Signed)
PATIENT NAME:  Caitlin Caldwell, HEGWOOD MR#:  [id number] DATE OF BIRTH:  1934/01/03  DATE OF CONSULTATION:  07/10/2013  REFERRING PHYSICIAN:  Vaughan Basta, MD CONSULTING PHYSICIAN:  Andria Meuse, NP/Darren Allen Norris, MD  PRIMARY CARE PHYSICIAN:  Dr. Carrie Mew.  REASON FOR CONSULTATION: Ascites.   HISTORY OF PRESENT ILLNESS: Caitlin Caldwell is a 81 year old female who had a prolonged hospitalization from April 17th to April 07, 2013. She had melena due to a large gastric ulcer. She was followed by Dr. Allen Norris and Dr. Marina Gravel, the surgeon, during this admission. She was managed medically and discharged home. She was supposed to follow up with Dr. Candace Cruise for repeat EGD to verify ulcer healing, however she has not made that appointment yet. Two weeks ago, she noticed she had increasing abdominal girth and distention. She reports a 44-pound weight gain over the last month. She has shortness of breath on exertion and lower extremity edema. She gives no history of chronic liver disease. She denies nausea, vomiting, fever. She denies anorexia. She denies heartburn, indigestion, dysphagia or odynophagia. She has never had abdominal paracentesis. She does have a history of diastolic congestive heart failure with ejection fraction of 50% to 55%, chronic atrial fibrillation, acute respiratory failure, chronic renal insufficiency and coronary artery disease status post CABG. She is being seen by cardiology this admission. She does note increased urinary frequency and hesitancy. LFTs are normal. Her INR 1.3.  Her hemoglobin is 9.1. She had a CT of abdomen and pelvis with contrast last July, which showed a normal liver but no recent liver imaging. She did have abdominal ultrasound which showed 4 quadrants of moderate ascites.   PAST MEDICAL AND SURGICAL HISTORY 1.  GI bleed secondary to peptic ulcer disease April 2014 with workup as above.  2.  Diastolic CHF, EF 35%.  3.  Chronic A. fib, not on anticoagulation due to GI bleed.   4.  History of hypovolemic shock.  5.  Acute respiratory failure.  6.  Chronic kidney disease.  7.  Hypertension.  8.  Osteoporosis.  9.  Coronary artery disease, status post CABG 1997.  10.  Degenerative joint disease.  11.  History of B12 deficiency.  12.  Depression.  13.  Morbid obesity.  14.  Insomnia.  15.  Aortic regurgitation.  16.  Pulmonary hypertension.  17.  Hypertriglyceridemia.  18.  Partial colectomy for diverticulosis at [facility].  19.  Thyroidectomy.  20.  Tonsillectomy.  21.  Ganglion cyst removed.  22.  A hysterectomy for fibroids.   MEDICATIONS PRIOR TO ADMISSION: Acetaminophen 500 mg 2 tablets every 8 hours p.r.n., aspirin 81 mg daily, atenolol 50 mg daily, buspirone 10 mg t.i.d., calcium 600 daily, esomeprazole 40 mg b.i.d., ferrous sulfate 325 mg b.i.d., fexofenadine 180 mg in the morning, furosemide 40 mg daily, gemfibrozil 600 mg b.i.d., Humulin N 10 units b.i.d., melatonin 3 mg at bedtime, multivitamin daily, potassium chloride 20 mEq 1/2 tablet q.a.m., pravastatin 40 mg at bedtime, vitamin C 500 mg b.i.d., zinc 1 tablet q.a.m.   ALLERGIES: BENADRYL, COLCHICINE, ETODOLAC, FOSAMAX, LEVAQUIN, REGLAN AND SULFA DRUGS.   SOCIAL HISTORY: She is a widow, she lives with her son in [place]. Her son is healthy. She denies any tobacco, alcohol or illicit drug use.   FAMILY HISTORY: There is no known family history of liver disease, colorectal carcinoma or chronic GI problems. Father has history of coronary artery disease and MI.    REVIEW OF SYSTEMS: See HPI, otherwise negative complete review of  systems.   PHYSICAL EXAMINATION VITAL SIGNS: Temp 98.1, pulse 60, respirations 20, blood pressure 111/67, O2 sat 98% on 2 L/min by nasal cannula.  GENERAL:  She is an obese, Caucasian female who is alert, oriented, pleasant, cooperative, in no acute distress.  HEENT: Sclerae clear, anicteric. Conjunctivae pink. Oropharynx pink and moist without lesions.  NECK:  Supple without mass or thyromegaly.  CHEST: Heart rate is irregularly regular without murmurs, clicks, rubs or gallops.  LUNGS: Clear to auscultation bilaterally.  ABDOMEN: Protuberant. She has tense ascites with lower abdominal mild erythema. Abdomen is mildly tender all over to palpation. There is no rebound tenderness or guarding. She has a positive fluid wave.  EXTREMITIES: With 1+ pitting bilateral pretibial edema.  MUSCULOSKELETAL: Good equal strength and movement bilaterally.  SKIN: Pink, warm and dry without any rash or jaundice.  NEUROLOGIC: Grossly intact.  PSYCHIATRIC: She is alert, pleasant, cooperative, normal mood and affect.   LABORATORY STUDIES: Glucose 196, BUN 40, creatinine 2.07, otherwise normal met-7. LFTs from 07/28 show albumin 2.7, AST 14, ALT 7, otherwise normal LFTs. Three troponins negative CK-MB and total CK negative. Hemoglobin 9.1, hematocrit 27.2, white blood cell count 5.4, platelets 199. INR 1.3, PTT 42.8 and prothrombin 15.8.   IMPRESSION: Caitlin Caldwell is a very pleasant 81-year-old female with diastolic congestive heart failure, atrial fibrillation, history of respiratory failure, pulmonary hypertension, aortic stenosis, chronic kidney disease and coronary artery disease who has a reported 40-pound weight gain and anasarca with 4 quadrants of moderate abdominal ascites. She has no history of known liver disease and normal liver on CT last year. She has had a recent large gastric ulcer managed medically April 2014. She is due for followup EGD to verify healing. Differentials for her ascites include congestive hepatopathy, malignancy, occult cirrhosis from other causes, renal failure and I doubt this is due to perforated peptic ulcer disease. She is going to need further evaluation with ultrasound-guided LVAP and fluid analysis. I have discussed her care with Dr. Darren Wohl, Dr. Vachhani and Dr. Cooper, the radiologist. Benefits outweigh the risk of bleeding for diagnostic  ultrasound-guided LVAP. Will recheck PTT in the morning and hold her aspirin and heparin in plans for LVAP with fluid studies in the morning. Fluid will be sent for cytology, cell count with diff., albumin, LDH and cultures.   I would like to thank you for allowing us to see in the care of Ms. Jamroz.    ____________________________ Kandice L. Jones, NP klj:[id number] D: 07/10/2013 14:46:00 ET T: 07/10/2013 15:11:31 ET JOB#: [id number]  cc: Kandice L. Jones, NP, <Dictator> Darren Wohl, MD Paul Y. Oh, MD Vaibhavkumar G. Vachhani, MD Miriam McLaughlin, PA-C Dr. Cooper, radiologist  KANDICE L JONES FNP ELECTRONICALLY SIGNED 07/11/2013 15:22 

## 2015-04-04 NOTE — Consult Note (Signed)
Chief Complaint:  Subjective/Chief Complaint Pt denies any abd pain.  Denies increasing SOB.  pTT remains elevated.   VITAL SIGNS/ANCILLARY NOTES: **Vital Signs.:   30-Jul-14 04:09  Vital Signs Type Routine  Temperature Temperature (F) 98.9  Celsius 37.1  Temperature Source oral  Pulse Pulse 61  Respirations Respirations 20  Systolic BP Systolic BP 161  Diastolic BP (mmHg) Diastolic BP (mmHg) 75  Mean BP 94  Pulse Ox % Pulse Ox % 98  Pulse Ox Activity Level  At rest  Oxygen Delivery 2L   Brief Assessment:  GEN well nourished, no acute distress, chronically ill appearing.  Pleasant.  A/Ox3   Cardiac Regular   Respiratory normal resp effort   Gastrointestinal details normal Soft  Nontender  +tense ascites   EXTR negative cyanosis/clubbing, negative edema   Additional Physical Exam skin: warm, dry   Lab Results: Routine Chem:  30-Jul-14 04:42   Glucose, Serum -  Glucose, Serum  171  BUN -  BUN  40  Creatinine (comp) -  Creatinine (comp)  2.03  Sodium, Serum -  Sodium, Serum 141  Potassium, Serum -  Potassium, Serum 3.7  Chloride, Serum -  Chloride, Serum 107  CO2, Serum -  CO2, Serum 26  Calcium (Total), Serum -  Calcium (Total), Serum 8.6  Anion Gap -  Anion Gap 8  Osmolality (calc) -  Osmolality (calc) 295  eGFR (African American) -  eGFR (African American)  27  eGFR (Non-African American) - (eGFR values <33m/min/1.73 m2 may be an indication of chronic kidney disease (CKD). Calculated eGFR is useful in patients with stable renal function. The eGFR calculation will not be reliable in acutely ill patients when serum creatinine is changing rapidly. It is not useful in  patients on dialysis. The eGFR calculation may not be applicable to patients at the low and high extremes of body sizes, pregnant women, and vegetarians.)  eGFR (Non-African American)  23 (eGFR values <655mmin/1.73 m2 may be an indication of chronic kidney disease (CKD). Calculated  eGFR is useful in patients with stable renal function. The eGFR calculation will not be reliable in acutely ill patients when serum creatinine is changing rapidly. It is not useful in  patients on dialysis. The eGFR calculation may not be applicable to patients at the low and high extremes of body sizes, pregnant women, and vegetarians.)  Result Comment METB - DUPLICATE ORDER  Result(s) reported on 11 Jul 2013 at 06:38AM.  B-Type Natriuretic Peptide ([facility] [id number](s) reported on 11 Jul 2013 at 06:57AM.)  Routine Coag:  30-Jul-14 04:42   Prothrombin  16.3  INR 1.3 (INR reference interval applies to patients on anticoagulant therapy. A single INR therapeutic range for coumarins is not optimal for all indications; however, the suggested range for most indications is 2.0 - 3.0. Exceptions to the INR Reference Range may include: Prosthetic heart valves, acute myocardial infarction, prevention of myocardial infarction, and combinations of aspirin and anticoagulant. The need for a higher or lower target INR must be assessed individually. Reference: The Pharmacology and Management of the Vitamin K  antagonists: the seventh ACCP Conference on Antithrombotic and Thrombolytic Therapy. [id number].1191ept:126 (3suppl): [id number] HCT value >55% may artifactually increase the PT.  In one study,  the increase was an average of 25%. Reference:  "Effect on Routine and Special Coagulation Testing Values of Citrate Anticoagulant Adjustment in Patients with High HCT Values." American Journal of Clinical Pathology 2006;126:400-405.)  Activated PTT (APTT)  43.4 (A HCT value >55%  may artifactually increase the APTT. In one study, the increase was an average of 19%. Reference: "Effect on Routine and Special Coagulation Testing Values of Citrate Anticoagulant Adjustment in Patients with High HCT Values." American Journal of Clinical Pathology 2006;126:400-405.)  Routine Hem:  30-Jul-14 04:42   WBC  (CBC) 5.8  RBC (CBC)  2.93  Hemoglobin (CBC)  8.9  Hematocrit (CBC)  26.8  Platelet Count (CBC) 202  MCV 92  MCH 30.6  MCHC 33.4  RDW  15.3  Neutrophil % 69.3  Lymphocyte % 16.2  Monocyte % 10.8  Eosinophil % 2.9  Basophil % 0.8  Neutrophil # 4.0  Lymphocyte #  0.9  Monocyte # 0.6  Eosinophil # 0.2  Basophil # 0.0 (Result(s) reported on 11 Jul 2013 at 06:52AM.)   Assessment/Plan:  Assessment/Plan:  Assessment [name] is a pleasant 81 y/o female with diastolic CHF, AF, hx resp failure, CKD, & CAD who had had reported 40+ # weight gain & anasarca with 4 quadrant abdominal ascites.  No hx liver disease & normal liver on CT last year.  Hx large gastric ulcer managed medically April 2014.  Due for FU EGD to verify healing.  Differentials include congestive hepatopathy, malignancy, occult cirrhosis from other causes, renal failure, doubt perforated PUD.   Plan 1) Paracentesis once pTT normal per radiologist 2) ASA/heparin on hold 3) pTT in AM 4) fluid for cytology, cell ct diff, albumin, LDH, cultures   Electronic Signatures: Caitlin Caldwell (NP)  (Signed 30-Jul-14 09:41)  Authored: Chief Complaint, VITAL SIGNS/ANCILLARY NOTES, Brief Assessment, Lab Results, Assessment/Plan   Last Updated: 30-Jul-14 09:41 by Caitlin Caldwell (NP)

## 2015-04-04 NOTE — Consult Note (Signed)
Chief Complaint:  Subjective/Chief Complaint Pt denies any abd pain.  pTT remains elevated.  Denies N/V.   VITAL SIGNS/ANCILLARY NOTES: **Vital Signs.:   01-Aug-14 08:00  Temperature Temperature (F) 98.6  Celsius 37  Temperature Source oral  Pulse Pulse 73  Respirations Respirations 18  Systolic BP Systolic BP 993  Diastolic BP (mmHg) Diastolic BP (mmHg) 75  Mean BP 91  Pulse Ox % Pulse Ox % 95  Pulse Ox Activity Level  At rest  Oxygen Delivery 2L; Nasal Cannula   Brief Assessment:  GEN well nourished, no acute distress, chronically ill appearing.  Pleasant.  A/Ox3   Cardiac Regular  Irregular   Respiratory normal resp effort   Gastrointestinal details normal Soft  Nontender  +tense ascites   EXTR negative cyanosis/clubbing, negative edema   Additional Physical Exam skin: warm, dry   Lab Results: Routine Chem:  01-Aug-14 04:19   Glucose, Serum  182  BUN  42  Creatinine (comp)  1.95  Sodium, Serum 138  Potassium, Serum 3.5  Chloride, Serum 103  CO2, Serum 27  Calcium (Total), Serum 9.0  Anion Gap 8  Osmolality (calc) 291  eGFR (African American)  28  eGFR (Non-African American)  24 (eGFR values <70m/min/1.73 m2 may be an indication of chronic kidney disease (CKD). Calculated eGFR is useful in patients with stable renal function. The eGFR calculation will not be reliable in acutely ill patients when serum creatinine is changing rapidly. It is not useful in  patients on dialysis. The eGFR calculation may not be applicable to patients at the low and high extremes of body sizes, pregnant women, and vegetarians.)  Routine Coag:  01-Aug-14 04:19   Prothrombin  16.0  INR 1.3 (INR reference interval applies to patients on anticoagulant therapy. A single INR therapeutic range for coumarins is not optimal for all indications; however, the suggested range for most indications is 2.0 - 3.0. Exceptions to the INR Reference Range may include: Prosthetic heart valves,  acute myocardial infarction, prevention of myocardial infarction, and combinations of aspirin and anticoagulant. The need for a higher or lower target INR must be assessed individually. Reference: The Pharmacology and Management of the Vitamin K  antagonists: the seventh ACCP Conference on Antithrombotic and Thrombolytic Therapy. CZJIRC.7893Sept:126 (3suppl): [id number] A HCT value >55% may artifactually increase the PT.  In one study,  the increase was an average of 25%. Reference:  "Effect on Routine and Special Coagulation Testing Values of Citrate Anticoagulant Adjustment in Patients with High HCT Values." American Journal of Clinical Pathology 2006;126:400-405.)  Activated PTT (APTT)  49.1 (A HCT value >55% may artifactually increase the APTT. In one study, the increase was an average of 19%. Reference: "Effect on Routine and Special Coagulation Testing Values of Citrate Anticoagulant Adjustment in Patients with High HCT Values." American Journal of Clinical Pathology 2006;126:400-405.)  Routine Hem:  01-Aug-14 04:19   WBC (CBC) 7.0  RBC (CBC)  3.05  Hemoglobin (CBC)  9.3  Hematocrit (CBC)  27.9  Platelet Count (CBC) 203  MCV 91  MCH 30.5  MCHC 33.4  RDW  15.1  Neutrophil % 74.0  Lymphocyte % 13.8  Monocyte % 10.6  Eosinophil % 1.2  Basophil % 0.4  Neutrophil # 5.2  Lymphocyte # 1.0  Monocyte # 0.7  Eosinophil # 0.1  Basophil # 0.0 (Result(s) reported on 13 Jul 2013 at 05:16AM.)   Assessment/Plan:  Assessment/Plan:  Assessment Tense Ascites/Anasarca in setting of CHF:  Awaiting paracentesis Hx PUD: Will need FU EGD  outpatient   Plan 1) Paracentesis once pTT normal per radiologist.   2) ASA/heparin on hold 3) fluid for cytology, cell ct diff, albumin, LDH, cultures   Electronic Signatures: Andria Meuse (NP)  (Signed 01-Aug-14 08:41)  Authored: Chief Complaint, VITAL SIGNS/ANCILLARY NOTES, Brief Assessment, Lab Results, Assessment/Plan   Last Updated:  01-Aug-14 08:41 by Andria Meuse (NP)

## 2015-04-04 NOTE — Consult Note (Signed)
Chief Complaint:  Subjective/Chief Complaint The patient says she is not feeling well today. Hb stable today. Still having bowel movements.   VITAL SIGNS/ANCILLARY NOTES: **Vital Signs.:   19-Apr-14 07:00  Vital Signs Type Routine  Temperature Temperature (F) 97.5  Celsius 36.3  Temperature Source axillary  Pulse Pulse 102  Respirations Respirations 30  Systolic BP Systolic BP 315  Diastolic BP (mmHg) Diastolic BP (mmHg) 61  Mean BP 75  Pulse Ox % Pulse Ox % 95  Pulse Ox Activity Level  At rest  Oxygen Delivery Room Air/ 21 %  Pulse Ox Heart Rate 104   Brief Assessment:  Gastrointestinal Normal   Gastrointestinal details normal Soft  Nontender  Nondistended   Additional Physical Exam A&O x 3   Lab Results: Routine Chem:  19-Apr-14 05:30   Result Comment LABS - This specimen was collected through an   - indwelling catheter or arterial line.  - A minimum of 71ms of blood was wasted prior    - to collecting the sample.  Interpret  - results with caution.  Result(s) reported on 31 Mar 2013 at 05:44AM.  Glucose, Serum  156  BUN  71  Creatinine (comp)  1.62  Sodium, Serum  148  Potassium, Serum 4.0  Chloride, Serum  118  CO2, Serum  17  Calcium (Total), Serum 8.6  Anion Gap 13  Osmolality (calc) 318  eGFR (African American)  35  eGFR (Non-African American)  31 (eGFR values <675mmin/1.73 m2 may be an indication of chronic kidney disease (CKD). Calculated eGFR is useful in patients with stable renal function. The eGFR calculation will not be reliable in acutely ill patients when serum creatinine is changing rapidly. It is not useful in  patients on dialysis. The eGFR calculation may not be applicable to patients at the low and high extremes of body sizes, pregnant women, and vegetarians.)  Routine Hem:  19-Apr-14 05:30   WBC (CBC) 9.3  RBC (CBC)  3.17  Hemoglobin (CBC)  9.2  Hematocrit (CBC)  26.5  Platelet Count (CBC)  124  MCV 84  MCH 29.2  MCHC 34.8  RDW   18.2  Neutrophil % 76.5  Lymphocyte % 10.2  Monocyte % 11.3  Eosinophil % 1.5  Basophil % 0.5  Neutrophil #  7.1  Lymphocyte #  0.9  Monocyte #  1.1  Eosinophil # 0.1  Basophil # 0.0   Assessment/Plan:  Assessment/Plan:  Assessment Upper GI bleed.   Plan Appears to be stable. Would follow Hb. If stable today then clear liquids tomorrow.   Electronic Signatures: WoLucilla LameMD)  (Signed 19-Apr-14 09:30)  Authored: Chief Complaint, VITAL SIGNS/ANCILLARY NOTES, Brief Assessment, Lab Results, Assessment/Plan   Last Updated: 19-Apr-14 09:30 by WoLucilla LameMD)

## 2015-04-04 NOTE — H&P (Signed)
PATIENT NAME:  Caitlin Caldwell, Caitlin Caldwell MR#:  [id number] DATE OF BIRTH:  1934-11-24  DATE OF ADMISSION:  03/29/2013  PRIMARY CARE PHYSICIAN:  Dr. Silver Huguenin.   REFERRING PHYSICIAN:  Dr. Bayard Males.   CHIEF COMPLAINT:  Diarrhea, bloody stools.   HISTORY OF PRESENT ILLNESS:  The patient is a 81 year old pleasant white female with a past medical history of hypertension, hyperlipidemia, diabetes mellitus insulin-dependent, previous history of peptic ulcer disease, coronary artery disease status post coronary artery bypass grafting, degenerative joint disease comes to the Emergency Department with complaints of multiple episodes of diarrhea that started since 3:00 p.m.  It started as black stools, watery diarrhea, followed by multiple black stools.  Concerning this, the patient was brought to the Emergency Department.  The patient continued to have bowel movements in the Emergency Department.  The patient's blood pressure dropped to a systolic blood pressure in the 60s and patient was found to have a hemoglobin of 6.2.  The patient was resuscitated with 2 liters of IV fluids and started on blood with improvement of the systolic blood pressure over 110.  The patient denies having any abdominal pain, nausea, vomiting.  Denies having any lightheadedness.  However, has been feeling severe generalized weakness.  Decreased urine output.  The patient had recent surgery for diverticulosis with a partial resection of the colon.   PAST MEDICAL HISTORY: 1.  Diastolic congestive heart failure with EF of 50% to 55% based on echocardiogram done from December 2013.  2.  Chronic atrial fibrillation, not on anticoagulation.  3.  Chronic renal insufficiency.  4.  Coronary artery disease status post bypass grafting in 1997.  5.  Hypertension.  6.  Osteoporosis.  7.  Degenerative joint disease.  8.  B12 deficiency.  9.   heart disease.  10.  Osteoporosis.  11.  Depression.  12.  Mild aortic regurgitation.  13.   Pulmonary hypertension.  14.  Hypertriglyceridemia.   PAST SURGICAL HISTORY:  1.  Coronary artery bypass grafting.  2.  Partial colectomy for diverticulosis at [facility].  3.  Thyroidectomy.  4.  Tonsillectomy.  5.  Ganglion cyst resection from the right hand.  6.  Hysterectomy for fibroids.   ALLERGIES:  1.  BENADRYL.  2.  FOSAMAX.  3.  LEVAQUIN.  4.  LODINE.  HOME MEDICATIONS: 1.  Zinc 1 tablet once a day.  2.  Vitamin C 500 mg 2 times a day.  3.  Ranitidine 150 mg 1 capsule once a day.  4.  Pravastatin 40 mg once a day.  5.  Multivitamin 1 tablet once a day.  6.  Melatonin 3 mg once a day.  7.  Klor-Con 0.5 mg once a day.  8.  Insulin N 10 units twice daily.  9.  Gemfibrozil 600 mg 2 times a day.  10.  Lasix 20 mg once a day.  11.  Fexofenadine 180 mg once a day.  12.  Feosol 325 mg 2 times a day.  13.  Ciprofloxacin eye drops.  14.  Cephalexin oral every 8 hours.  15.  Calcium 600 mg with vitamin D 2 times a day.  16.  Buspirone 1 tablet 3 times a day.  17.  Atenolol 500 mg once a day.  18.  Aspirin 81 mg daily.  19.  Acetaminophen 2 tablets every 8 hours as needed.   SOCIAL HISTORY:  No history of smoking, drinking alcohol or using illicit drugs.  Lives with her son.   FAMILY HISTORY:  Father had coronary artery disease and MI at the age of 79.  Mother with 2 MIs, deceased at the age of 79.   REVIEW OF SYSTEMS: CONSTITUTIONAL:  Severe generalized weakness.   EYES:  No redness or blurry vision.  EARS, NOSE, THROAT:  No ear pain, hearing loss or discharge.  RESPIRATORY:  Denies any cough, shortness of breath.  CARDIOVASCULAR:  Denies any chest pain, palpitations.  GASTROINTESTINAL:  No nausea, vomiting.  Has bloody stools.  No abdominal pain.   GENITOURINARY:  No dysuria or hematuria.   ENDOCRINE:  No polyuria or polydipsia.  HEMATOLOGIC:  No easy bruising or bleeding.  SKIN:  No rashes or lesions.  MUSCULOSKELETAL:  Has no joint pains and aches.  NEUROLOGIC:  No  weakness or sensory loss.   PHYSICAL EXAMINATION: GENERAL: This is a well-built, well-nourished, age-appropriate female lying down in the bed, looks pale, lethargic.  VITAL SIGNS:  Temperature 97.6, pulse 89, blood pressure 115/56, respiratory rate of 18, oxygen saturation is 95% on room air.  HEENT:  Head normocephalic, atraumatic.  Eyes, no scleral icterus.  Conjunctivae pale.  Pupils equal and react to light.  Extraocular movements are intact.  Mucous membranes, mild dryness.  No pharyngeal erythema.  NECK:  Supple.  No lymphadenopathy.  No JVD.  No carotid bruit.  No thyromegaly.  CHEST:  Has no focal tenderness.  LUNGS:  Bilaterally clear to auscultation.  HEART:  S1 and S2, regular rate, systolic murmur is heard.  The patient has chronic pedal edema in Unna boots.  ABDOMEN:  Mildly obese, mild distention.  Bowel sounds are plus.  Soft, nontender, nondistended.  EXTREMITIES:  As mentioned above, in Unna boots.  MUSCULOSKELETAL:  Good range of motion in all the extremities.  SKIN:  No rash or lesions.   LYMPHATIC:  No cervical, axillary or inguinal lymphadenopathy.  NEUROLOGIC:  The patient is alert, oriented to place, person and time.  Cranial nerves II through XII intact.  Motor 5 by 5 in upper and lower extremities.   LABORATORY DATA:  CMP, BUN 86, creatinine of 1.89.  The rest of all the values are within normal limits.  Lipase is 149.  CBC, WBC of 9.6, hemoglobin 6.2, platelet count of 202.  Coag profile, PT 17.8, INR of 1.5.   ASSESSMENT AND PLAN:  The patient is a 81 year old female who comes to the Emergency Department with multiple episodes of bloody stools, hypotensive.  1.  Gastrointestinal bleed.  The patient does not have any nausea, however has elevated BUN.  The possibility of duodenal bleed.  The patient has history of peptic ulcer disease many years back.  There is also possibility of bleeding from the anastomotic site.  We will admit the patient to the ICU.  Continue with  Protonix drip.  Consulted GI.  The patient received 2 units of packed red blood cells.  GI will see the patient tonight.  We will continue to monitor CBC q. 6 hours.  We will transfuse for hemoglobin less than 8.   2.  Hypovolemic shock from acute blood loss of large amount.  Continue with resuscitation and transfusion as needed.  3.  Anemia secondary to acute blood loss with a hemoglobin drop of 6.2.  Continue to monitor.  4.  Diabetes mellitus, insulin dependent.  Keep the patient on sliding scale insulin.  Also give 5 units of insulin at a decreased dose from the home medication.  5.  Hypertension.  Holding all blood pressure medications.  6.  Hyperlipidemia.  We will hold all statins for now.  7.  Keep the patient on deep vein thrombosis prophylaxis with SCDs.   CRITICAL CARE TIME SPENT:  60 minutes.    ____________________________ Susa Griffins, MD pv:ea D: 03/29/2013 02:02:50 ET T: 03/29/2013 02:58:10 ET JOB#: [id number]  cc: Susa Griffins, MD, <Dictator> Yetta Flock, MD Susa Griffins MD ELECTRONICALLY SIGNED 03/31/2013 7:30

## 2015-04-04 NOTE — Discharge Summary (Signed)
PATIENT NAME:  Caitlin Caldwell, Caitlin Caldwell MR#:  [id number] DATE OF BIRTH:  1934-11-22  DATE OF ADMISSION:  07/08/2013 DATE OF DISCHARGE:  07/18/2013  Please see interim discharge summary dictated by Dr. Elisabeth Pigeon on August 3 for course of admission until then.  DISCHARGE DIAGNOSES: 1. Acute diastolic heart failure, well compensated now with an old echo showing severe pulmonary hypertension. Artery systolic pressure greater than 100.  2.  Moderate ascites,  status post paracentesis with 2 liters of fluid removal on August 4.  3.  Acquired coagulopathy now resolved.  4.  Urinary tract infection. Treated for acute on chronic kidney disease, stage III, close to baseline with a creatinine 1.2.  5.  Chronic atrial fibrillation, not a candidate for anticoagulation due to fall and large gastric ulcer.  6.  Depression, on buspirone  on Lexapro and Lunesta.   SECONDARY DIAGNOSES: 1.  History of diastolic heart failure with ejection fraction of 50% to 55%.  2.  Chronic atrial fibrillation, not on anticoagulation due to fall and recent gastric ulcer.  3.  History chronic kidney disease.  4.  Hypertension.  5.  Osteoporosis.  6.  History of coronary artery disease, status post coronary artery bypass graft.  7.  Degenerative joint disease.  8.  History of B12 deficiency.  9.  Depression.  10.  Morbid obesity.  11.  Insomnia.  12.  Hypertriglyceridemia.   CONSULTATION:   1.  Nephrology, Dr. Wynelle Link.   2.  Psychiatry, Dr. Garnetta Buddy.  3.  Palliative care, Dr. Harvie Junior.  4.  Speech therapy.  5.  GI, Dr. Servando Snare.  6.  Cardiology, Dr. Gwen Pounds.   PROCEDURES AND RADIOLOGY:  1.  Paracentesis, ultrasound-guided performed on August 4 with 2 liters of fluid removal.  2.  Chest x-ray on July 27, showed atelectasis versus infiltrate within the lung bases, left greater than right. Interstitial infiltrate possibly representing pulmonary edema.  3.  Abdominal ultrasound on the July 28 showed moderate amount ascites.     Ultrasound-guided paracentesis as listed above.  Limited abdominal ultrasound on August 4, showed moderate ascites. Bidirectional hepatic venous flow and portal venous flow. Normal-appearing splenic venous waveform. Findings consistent with pseudocyst. No splenomegaly.   A 2-D echocardiogram on July 28, showed normal LV systolic function with ejection fraction of 50% to 55%. Severely enlarged right ventricle. Normal global LV systolic function. Severely dilated right atrium. Severe tricuspid regurgitation. She did have mild to moderate mitral valve regurgitation. Mild to moderate aortic valve stenosis. Severely elevated pulmonary artery systolic pressure.   MAJOR LABORATORY PANEL: Urinalysis on admission showed 84 WBCs, 2+ leukocyte esterase, 2+ bacteria, WBC in clumps present.   Urine culture on August 3 was negative.   HISTORY AND SHORT HOSPITAL COURSE: The patient is a 81 year old female with the above-mentioned medical problems who was admitted for acute diastolic heart failure. Please see Dr. Serita Grit Patel's dictated history and physical for further details. Please see interim discharge summary dictated by Dr. Elisabeth Pigeon on August 3, for detailed course from admission until August 3. The patient underwent ultrasound-guided paracentesis on August 4 for moderate ascites, which made her significantly better, although she remained quite depressed and very weak, for which physical therapy consultation was obtained who recommended skilled nursing facility. The patient was also evaluated by psychiatry, Dr. Garnetta Buddy and was started on antidepressant medication. The patient was evaluated by palliative care, Dr. Ernestene Kiel team, who recommended palliative care nurse practitioner to follow up while at the facility, considering her high risk for readmission. If  she does end up coming back, may consider hospice at that point. Her son was in agreement. The patient was stable enough to be discharged back to skilled  nursing facility today with her breathing being close to her baseline. The patient is in agreement with the discharge plan. On the date of discharge, her vital signs were as follows: Temperature 98, heart rate 167 per minute, pulmonary respirations 20 per minute, pulmonary blood pressure 124/70 mmHg. She was saturating 90% on room air.   PERTINENT PHYSICAL EXAMINATION ON THE DATE OF DISCHARGE:  CARDIOVASCULAR: S1, S2 normal. No murmurs, rubs or gallops.  LUNGS: Clear to auscultation bilaterally. No wheezing, rales, rhonchi or crepitation.  ABDOMEN: Soft, benign.  NEUROLOGIC: Nonfocal on examination.  The rest of the physical examination remained at baseline.    DISCHARGE MEDICATIONS: 1.  Melatonin 3 mg p.o. at bedtime.  2.  Vitamin C 500 mg p.o. b.i.d.  3.  Gemfibrozil 600 mg p.o. b.i.d.  4.  Pravastatin  40 mg p.o. at bedtime.  5.  Tylenol 1000 mg p.o. every 8 hours as needed.  6.  Lasix 40 mg p.o. daily.  7.  Potassium chloride 20 mEq 1/2 tablet p.o. daily.  8.  Atenolol 50 mg p.o. daily.  9.  Zinc 1 tablet p.o. daily.  10.  Aspirin 81 mg p.o. daily.  11.  Ferrous sulfate 325 mg p.o. b.i.d.  12.  Calcium with vitamin D 1 tablet p.o. daily.  13.  Multivitamin once daily.  14.  Fexofenadine 180 mg p.o.  15.  Omeprazole 40 mg p.o. b.i.d.  16.  Humulin N 10 units subcutaneous b.i.d.  17.  Lexapro 10 mg p.o. daily.  18.  Buspirone 5 mg p.o. b.i.d.  19.  Eszopiclone 2 mg p.o. daily at bedtime.   DISCHARGE DIET: Low sodium.   DISCHARGE ACTIVITY: As tolerated.   DISCHARGE DIET: Consistency would be mechanical soft with thin liquids and ground meats with gravy added to moisten. Strict aspiration precautions. Medication in puree assist with positioning and tray set up at all meals. Moisten food. Soft easy to eat food; yogurt, ice cream and pudding to be included.    DISCHARGE INSTRUCTIONS AND FOLLOWUP: The patient was instructed to follow up with her primary care physician, Dr. Silver HugueninAileen  Miller in 1 to 2 weeks. She will need followup with Dr. Wynelle LinkKolluru from nephrology in 2 to 4 weeks and  outpatient psychiatrist in 4-6 weeks.  She was set up to get physical therapy evaluation and management while at the facility and was strongly recommended to have palliative care nurse practitioner to follow while at the facility. She remains at very high risk for readmission/rehospitalization, at which point, she may be a hospice candidate.  TOTAL SPENT ON THIS PATIENT:  55 minutes.    ____________________________ Ellamae SiaVipul S. Sherryll BurgerShah, MD vss:cc D: 07/18/2013 13:47:00 ET T: 07/18/2013 14:58:42 ET JOB#: [id number]  cc: Yetta FlockAileen H. Miller, MD Ardeen FillersUzma S. Garnetta BuddyFaheem, MD Lamar BlinksBruce J. Kowalski, MD Mosetta PigeonHarmeet Singh, MD Ned GraceNancy Phifer, MD Kelseigh Diver S. Sherryll BurgerShah, MD, <Dictator>   Ellamae SiaVIPUL S Roy Lester Schneider HospitalHAH MD ELECTRONICALLY SIGNED 07/22/2013 8:22

## 2015-04-04 NOTE — Discharge Summary (Signed)
PATIENT NAME:  Caitlin Caldwell, Caitlin Caldwell MR#:  [id number] DATE OF BIRTH:  [birth year]  DATE OF ADMISSION:  03/29/2013 DATE OF DISCHARGE:  04/07/2013  ADMITTING DIAGNOSIS:  Dark-colored stools.   DISCHARGE DIAGNOSES:  1.  Melena due to acute bleeding gastric ulcers, status post esophagogastroduodenoscopy, treated with Protonix and transfusions, seen by Surgery, felt to be not a surgical candidate.  2.  Hypovolemic shock due to acute blood loss. Blood pressure now stable. Hemoglobin is stable.  3.  Acute blood loss anemia as a result both of an upper gastrointestinal bleed, status post multiple packed red blood cells as well as transfusion with fresh frozen platelets.  4.  Diabetes.  5.  Chronic kidney disease, stage II.  6.  Electrolyte imbalances status post replacement.  7.  Acute hypoxic respiratory failure, felt to be due to volume overloaded, treated with IV diuresis. Her respiratory failure is resolved. Due to diastolic congestive heart failure. 8.  Hyperlipidemia.  9.  Chronic atrial fibrillation, not anticoagulation candidate due to fall risk and now significant bleeding.  10.  Coronary artery disease, status post coronary artery bypass graft in 1997.  11.  A history of B12 deficiency.  12.  Osteoporosis.  13.  A history of mild aortic regurgitation.  14.  A history of pulmonary hypertension.  15.  A history of hypertriglyceridemia.  16.  Status post coronary artery bypass graft.  17.  Status post partial colectomy for diverticulosis at [facility].  18.  Status post thyroidectomy.  19.  Status post tonsillectomy.  20.  Status post ganglion cyst resection from the right hand.  21.  Status post hysterectomy.   CONSULTANTS:  GI:  Dr. Bluford Kaufmann. Subsequently seen by Dr. Servando Snare, Dr. Natale Lay of Surgery, Dr. Excell Seltzer.  PERTINENT LABS AND EVALUATIONS:  Admitting glucose 156, BUN 71, creatinine 1.62, sodium 148, potassium 4.0, chloride was 118, CO2 was 17, calcium was 8.6. WBC count was 9.3, hemoglobin 9.2,  platelet count was 124. Most recent BMP, glucose is 132 on the 26th, BUN 29, potassium was slightly low at 3.4, chloride 109. WBC count 9.5. Hemoglobin 9, platelet count is 199 today. EGD showed a gastric ulcer containing adherent clot with some fresh blood noted in the pyloric region. Unable to remove the clot, active bleeding around the clot.   HOSPITAL COURSE:  Please refer to H and P done by the admitting physician. The patient is a 81 year old pleasant white female with a history of hypertension, hyperlipidemia, diabetes, a previous history of peptic ulcer disease, coronary artery disease, who presented to the ED with complaining of having multiple dark-colored, melanotic stools. The patient was placed in the CCU and started on a Protonix drip. She was seen by GI who went ahead and did an EGD. The EGD showed an active GI bleeding. That they were unable to cauterize or stop with epinephrine. An urgent surgical consult was obtained by Dr. Kemper Durie, who reviewed the patient's case and felt that she was a very high candidate for a GI surgery. Therefore, she was conservatively managed. She received multiple units of packed RBCs. She was kept in the ICU. She was kept on a Protonix drip. With these interventions her bleeding did eventually stop. She also required aggressive IV fluids as well as pressors. At this point she has no further evidence of bleeding and has been on Protonix.  1.  Acute respiratory failure. The patient was noted to be hypoxic and was felt to have acute diastolic CHF, which was treated with  Lasix. Currently, she is off O2.  2.  Generalized weakness. The patient is very debilitated and very weak. It was recommended rehab. However, the patient only had 3 days left per her insurance so it was discussed with the family and they decided to take her home with Home Health. The patient also did have some gluey eyes on the left. She was seen by Ophthalmology who recommended eyedrops which she is  continued. At this time, she is doing much better and is stable for discharge. Discharge instructions for acute diastolic congestive heart failure given.   DISCHARGE MEDICATIONS:  Iron sulfate 325 mg 1 tab p.o. b.i.d., melatonin 3 mg at bedtime, vitamin C 500 mg 1 tab p.o. b.i.d., atenolol 50, 1 tab p.o. daily, gemfibrozil 600 mg 1 tab p.o. b.i.d., pravastatin 40 at bedtime, Tylenol 1000 mg q.8 p.r.n., bupropion 10 mg 1 tab p.o. t.i.d. as taking previously, calcium plus vitamin D 1 tab p.o. b.i.d., multivitamin with iron tablets, 1 tab p.o. daily, Klor-Con 20 mEq 1/2 tab daily, fexofenadine 180 daily, insulin, isophane 10 units b.i.d., Lasix 20 daily, polymyxin B 1 drop to both eyes q.6 hours, dexamethasone tobramycin ophthalmic drops 4 times a day,  omeprazole 40, 1 tab p.o. b.i.d., erythromycin drops to both eyes for 4 days.   Home Health Services have been arranged nurse, nurse's aide and physical therapy. The patient to have bilateral Uno boots.    DIET:  Low sodium, low fat, low cholesterol, carbohydrate-controlled diet.   ACTIVITY:  As tolerated per PT evaluation and treat.  FOLLOW UP INSTRUCTIONS:  1.  Followed by Dr. Wyn Quakerew as before for leg wounds.  2.  Follow with Dr. Neena RhymesKilleen Miller in 1 to 2 weeks.  3.  Follow up with Dr. Bluford Kaufmannh in 2 to 4 weeks for gastric ulcer.  TIME SPENT:  45 minutes spent on the discharge.   ____________________________ Lacie ScottsShreyang H. Allena KatzPatel, MD shp:jm D: 04/07/2013 13:38:13 ET T: 04/07/2013 17:58:51 ET JOB#: [id number]  cc: Eason Housman H. Allena KatzPatel, MD, <Dictator> Charise CarwinSHREYANG H Carmeron Heady MD ELECTRONICALLY SIGNED 04/13/2013 20:05

## 2015-04-04 NOTE — Consult Note (Signed)
Chief Complaint:  Subjective/Chief Complaint Upper GI bleed. Patient continues to have dark material from NG. Had an EGD by Dr. Bluford Kaufmannh with inability to stop the bleeding.   VITAL SIGNS/ANCILLARY NOTES: **Vital Signs.:   18-Apr-14 12:20  Vital Signs Type Blood Transfusion  Temperature Temperature (F) 97.8  Celsius 36.5  Temperature Source oral  Pulse Pulse 86  Pulse source if not from Vital Sign Device per cardiac monitor  Respirations Respirations 29  Systolic BP Systolic BP 103  Diastolic BP (mmHg) Diastolic BP (mmHg) 67  Mean BP 79  BP Source  if not from Vital Sign Device non-invasive  Pulse Ox % Pulse Ox % 99  Oxygen Delivery 1L   Brief Assessment:  Respiratory normal resp effort   Gastrointestinal details normal Soft  Nontender   Lab Results:  Routine BB:  18-Apr-14 09:00   Fresh Frozen Plasma Unit 1 Ready (Result(s) reported on 30 Mar 2013 at 09:54AM.)   Assessment/Plan:  Assessment/Plan:  Assessment Upper GI bleed.   Plan The patient is being considered for surgery. Patient getting blood at present.   Electronic Signatures: Midge MiniumWohl, Alethia Melendrez (MD)  (Signed 18-Apr-14 13:08)  Authored: Chief Complaint, VITAL SIGNS/ANCILLARY NOTES, Brief Assessment, Lab Results, Assessment/Plan   Last Updated: 18-Apr-14 13:08 by Midge MiniumWohl, Khaled Herda (MD)

## 2015-04-04 NOTE — Consult Note (Signed)
Chief Complaint:  Subjective/Chief Complaint Upper GI bleed. NG removed due to no further drainage. Patient continues to have dark stools. No complaint.   VITAL SIGNS/ANCILLARY NOTES: **Vital Signs.:   20-Apr-14 08:00  Vital Signs Type Routine  Pulse Pulse 88  Respirations Respirations 26  Systolic BP Systolic BP 297  Diastolic BP (mmHg) Diastolic BP (mmHg) 55  Mean BP 74  Pulse Ox % Pulse Ox % 99  Pulse Ox Activity Level  At rest  Oxygen Delivery 2L  Pulse Ox Heart Rate 84   Brief Assessment:  Respiratory normal resp effort   Gastrointestinal Normal   Additional Physical Exam NAD A&O x 3   Lab Results: Routine Chem:  20-Apr-14 05:27   Result Comment LABS - This specimen was collected through an   - indwelling catheter or arterial line.  - A minimum of 40ms of blood was wasted prior    - to collecting the sample.  Interpret  - results with caution.  Result(s) reported on 01 Apr 2013 at 05:44AM.  Glucose, Serum  118  BUN  68  Creatinine (comp)  1.59  Sodium, Serum  149  Potassium, Serum 3.7  Chloride, Serum  119  CO2, Serum  19  Calcium (Total), Serum  8.4  Anion Gap 11  Osmolality (calc) 317  eGFR (African American)  36  eGFR (Non-African American)  31 (eGFR values <635mmin/1.73 m2 may be an indication of chronic kidney disease (CKD). Calculated eGFR is useful in patients with stable renal function. The eGFR calculation will not be reliable in acutely ill patients when serum creatinine is changing rapidly. It is not useful in  patients on dialysis. The eGFR calculation may not be applicable to patients at the low and high extremes of body sizes, pregnant women, and vegetarians.)  Routine Hem:  20-Apr-14 05:27   WBC (CBC) 8.7  RBC (CBC)  2.98  Hemoglobin (CBC)  8.6  Hematocrit (CBC)  25.4  Platelet Count (CBC)  128  MCV 85  MCH 28.9  MCHC 34.0  RDW  18.1  Neutrophil % 76.4  Lymphocyte % 10.2  Monocyte % 10.0  Eosinophil % 3.0  Basophil % 0.4   Neutrophil #  6.7  Lymphocyte #  0.9  Monocyte # 0.9  Eosinophil # 0.3  Basophil # 0.0   Assessment/Plan:  Assessment/Plan:  Assessment Upper GI bleed. NG removed. Continued dark stools.   Plan Continue current care. Follow Hb.   Electronic Signatures: WoLucilla LameMD)  (Signed 20-Apr-14 09:58)  Authored: Chief Complaint, VITAL SIGNS/ANCILLARY NOTES, Brief Assessment, Lab Results, Assessment/Plan   Last Updated: 20-Apr-14 09:58 by WoLucilla LameMD)

## 2015-04-04 NOTE — H&P (Signed)
PATIENT NAME:  Caitlin Caldwell, Caitlin Caldwell MR#:  [id number] DATE OF BIRTH:  08/05/1934  DATE OF ADMISSION:  07/08/2013  PRIMARY CARE PROVIDER:  Dr. Merlinda Frederick.  She used to see Dr. Silver Huguenin, but now Dr. Silver Huguenin has left.   EMERGENCY DEPARTMENT REFERRING PHYSICIAN:  Dr. Clemens Catholic.   CHIEF COMPLAINT:  Shortness of breath, abdominal swelling, lower extremity swelling.   HISTORY OF PRESENT ILLNESS:  The patient is a 81 year old white female who had a prolonged hospitalization from 03/29/2013 to 04/07/2013.  At that time, she was initially presented with melena due to an acute bleeding gastric ulcer.  She developed hypovolemic shock.  She also developed acute hypoxic respiratory failure requiring IV Lasix.  She was subsequently discharged home.  The patient reports that over the past few weeks she has had progressive gaining over her weight.  She used to be 130 pounds, but now she is 174 pounds.  She also has noticed that her abdomen is very distended and swollen.  She also has significant leg swelling.  She also is complaining of shortness of breath.  She reports that she is able to lie at bedtime on her side.  She has not had any chest pains or palpitations.  She has not had any nausea, vomiting or diarrhea.  Has not had any fevers or chills.  She reports that when she urinates she is able to urinate a little at a time.  She denies any calf pain.   PAST MEDICAL HISTORY:  Significant for:  1.  Diastolic CHF with an EF of 50% to 55% based on echocardiogram done from December 2013.  2.  Chronic atrial fibrillation, not on anticoagulation due to falls and recent gastric ulcer.  3.  Recent history of hypovolemic shock.  4.  History of acute respiratory failure during previous hospitalization.  5.  Chronic renal insufficiency.  Creatinine last on April 26 was 1.30, has gone as high as 1.91 and 1.93 in the past.  Today is elevated at 1.87.  6.  Hypertension.  7.  Osteoporosis.  8.  History of coronary artery  disease, status post CABG in 1997.  9.  Degenerative joint disease.  10.  History of B12 deficiency.  11.  Depression.  12.  Morbid obesity.  13.  Insomnia.  14.  History of mild aortic regurgitation.  15.  History of pulmonary hypertension.  16.  Hypertriglyceridemia.   PAST SURGICAL HISTORY:  1.  Status post CABG.  2.  Status post partial colectomy for diverticulosis at [facility] in the past.  3.  Status post thyroidectomy.  4.  Status post tonsillectomy.  5.  Status post ganglion cyst.  6.  Hysterectomy for fibroids.   ALLERGIES:  BENADRYL, FOSAMAX, LEVAQUIN AND LODINE.    CURRENT MEDICATIONS:  Tylenol 500 mg 2 tabs q. 8 as needed, aspirin 81 mg 1 tab by mouth daily, atenolol 50 daily, buspirone 10 mg 1 tab by mouth 3 times daily, calcium plus vitamin D 1 tab by mouth daily, esomeprazole 40 daily, iron sulfate 325 mg 1 tab by mouth twice daily, fexofenadine 180 daily, Lasix 40 daily, gemfibrozil 600 1 tab by mouth twice daily, Humulin N 10 units subQ twice daily, melatonin 3 mg at bedtime, multivitamins daily, KCl 20 mEq 1/2 tab daily, pravastatin 40 daily, vitamin C 500 mg 1 tab by mouth twice daily, zinc 1 tab by mouth daily.   SOCIAL HISTORY:  The patient has no history of smoking, drinking alcohol or using illicit drugs.  Lives with her son.   FAMILY HISTORY:  Father had coronary artery disease and MI at age 79.    REVIEW OF SYSTEMS:  CONSTITUTIONAL:  Complains of no fevers.  Complains of fatigueness, weakness.  No pain.  Complains of significant weight gain.  EYES:  No blurred or double vision.  No pain.  No redness.  No inflammation.  EARS, NOSE AND THROAT:  Denies tinnitus, ear pain.  No hearing loss.  No seasonal or year-round allergies.  No difficulty swallowing.  RESPIRATORY:  Denies any cough, wheezing.  No hemoptysis.  Has dyspnea.  CARDIOVASCULAR:  Denies any chest pain or orthopnea.  Complains of edema.  Has chronic atrial fibrillation, dyspnea on exertion.  No syncope.   GASTROINTESTINAL:  Denies any nausea, vomiting or diarrhea.  Denies any abdominal pain.  No hematemesis.  No melena.  Has a history of large gastric ulcer during previous hospitalization.  No jaundice.  No rectal bleeding.  GENITOURINARY:  Denies any dysuria, hematuria, renal calculus or frequency.  ENDOCRINE:  Denies any polyuria, nocturia or thyroid problems.  HEMATOLOGIC AND LYMPHATIC:  Has a history of chronic anemia.  Denies any easy bruisability or bleeding.  SKIN:  No acne.  No rash.  No changes in mole, hair or skin.  MUSCULOSKELETAL:  Has pain related to osteoarthritis.  No gout.  NEUROLOGICAL:  No numbness.  No CVA.  No TIA.  No seizures.  PSYCHIATRIC:  No anxiety.  No insomnia.  No ADD.  No OCD.   PHYSICAL EXAMINATION: VITAL SIGNS:  Temperature 98.8, pulse 68, respirations 14, blood pressure 142/60, O2 is 96% on 2 liters.  GENERAL:  The patient is a morbidly obese female, appears chronically ill.  HEENT:  Head atraumatic, normocephalic.  Eyes, no scleral icterus.  No conjunctival pallor.  Pupils equally round, reactive to light and accommodation.  Extraocular movements intact.  Mucous membranes are moist.  No pharyngeal erythema.  No lesions.  External nose exam shows no lesions or drainage.  Ear, there is no erythema or drainage.   NECK:  Supple and symmetric.  No masses.  Thyroid midline.  RESPIRATORY:  Good respiratory effort with crackles at the base.  There are no rales, rhonchi, or wheezing.  CARDIOVASCULAR:  Irregularly irregular rhythm with a systolic murmur.  PMI is not displaced.  ABDOMEN:  Soft, nontender, nondistended.  Positive bowel sounds x 4.  There is no splenomegaly.  GENITOURINARY:  Deferred.  MUSCULOSKELETAL:  There is no erythema or swelling or no joint effusions.  SKIN:  There is no rash.  LYMPHATICS:  No lymph nodes palpable.  VASCULAR:  Good DP, PT pulses.  NEUROLOGICAL:  Cranial nerves II through XII grossly intact.  Strength 5 out of 5.  PSYCHIATRIC:  Not  anxious or depressed.   LABORATORY DATA:  Evaluations in the ED, glucose 212, BUN 39, creatinine 1.87, sodium 140, potassium 4.1, chloride 108, CO2 is 25, calcium 9.0.  LFTs showed an albumin of 2.8.  CPK was 68.  CK-MB was 0.5.  Troponin less than 0.02.  WBC 6.4, hemoglobin 9.8, platelet count 214.  Chest x-ray shows mild interstitial edema.   ASSESSMENT AND PLAN:  The patient is a 81 year old white female with multiple medical problems including diastolic congestive heart failure, pulmonary hypertension, anemia, morbid obesity, chronic renal failure, history of large gastric ulcer in April, presents with progressive shortness of breath, weight gain, lower extremity and abdominal swelling.  1.  Shortness of breath, abdominal lower extremity swelling due to acute diastolic  congestive heart failure.  The patient had an echo six months ago, so I will not repeat.  We will give her IV Lasix, monitor her renal function for worsening.  We will also ask her cardiologist to come see the patient.  2.  Acute renal failure on chronic renal failure.  We will follow her renal function on Lasix, may need nephrology to see if worsening renal function.  3.  Chronic atrial fibrillation.  The patient is not a candidate for any type of anticoagulation.  She is only on atenolol which we will continue and an aspirin, low dose.  4.  Coronary artery disease.  Continue low-dose aspirin.  5.  History of recent large gastric ulcer requiring transfusion.  Currently not on any proton pump inhibitors.  I will place her on Protonix.  We continue esomeprazole as taking at home. 6.  Depression.  Continue buspirone.   7.  Hyperlipidemia.  Continue pravastatin.  8.  CODE STATUS:  DO NOT RESUSCITATE as previously.  The patient does have the yellow DO NOT RESUSCITATE form.   NOTE:  50 minutes spent on this H and P.     ____________________________ Lacie Scotts. Allena Katz, MD shp:ea D: 07/08/2013 19:18:46 ET T: 07/08/2013 20:13:19  ET JOB#: [id number]  cc: Tiaja Hagan H. Allena Katz, MD, <Dictator> Charise Carwin MD ELECTRONICALLY SIGNED 07/16/2013 9:03

## 2015-04-04 NOTE — Consult Note (Signed)
Chief Complaint:  Subjective/Chief Complaint Sleepy. No abd complaints. Eating ok. No abd pain. No BM recently.   VITAL SIGNS/ANCILLARY NOTES: **Vital Signs.:   23-Apr-14 13:50  Vital Signs Type Routine  Temperature Temperature (F) 99.4  Celsius 37.4  Temperature Source oral  Pulse Pulse 93  Respirations Respirations 18  Systolic BP Systolic BP 118  Diastolic BP (mmHg) Diastolic BP (mmHg) 77  Mean BP 90  Pulse Ox % Pulse Ox % 99  Pulse Ox Activity Level  At rest  Oxygen Delivery 2L   Brief Assessment:  Cardiac Regular   Respiratory clear BS   Gastrointestinal Normal   Lab Results: Routine Chem:  23-Apr-14 04:42   Magnesium, Serum  1.5 (1.8-2.4 THERAPEUTIC RANGE: 4-7 mg/dL TOXIC: > 10 mg/dL  -----------------------)  Glucose, Serum  204  BUN  37  Creatinine (comp) 1.29  Sodium, Serum 141  Potassium, Serum  3.4  Chloride, Serum  111  CO2, Serum 22  Calcium (Total), Serum  8.2  Anion Gap 8  Osmolality (calc) 296  eGFR (African American)  47  eGFR (Non-African American)  40 (eGFR values <60mL/min/1.73 m2 may be an indication of chronic kidney disease (CKD). Calculated eGFR is useful in patients with stable renal function. The eGFR calculation will not be reliable in acutely ill patients when serum creatinine is changing rapidly. It is not useful in  patients on dialysis. The eGFR calculation may not be applicable to patients at the low and high extremes of body sizes, pregnant women, and vegetarians.)  Routine Hem:  23-Apr-14 04:42   WBC (CBC) 7.2  RBC (CBC)  2.84  Hemoglobin (CBC)  8.4  Hematocrit (CBC)  24.9  Platelet Count (CBC)  136  MCV 87  MCH 29.6  MCHC 33.8  RDW  18.7  Neutrophil % 75.9  Lymphocyte % 9.9  Monocyte % 12.3  Eosinophil % 1.5  Basophil % 0.4  Neutrophil # 5.5  Lymphocyte #  0.7  Monocyte # 0.9  Eosinophil # 0.1  Basophil # 0.0 (Result(s) reported on 04 Apr 2013 at 05:26AM.)   Assessment/Plan:  Assessment/Plan:  Assessment  UGI bleeding from gastric ulcer. No recent bleeding.   Plan Will need f/u with us in few weeks. Make sure patient stays on PPI BID. Will prob need repeat EGD in 1 month to assess for ulcer healing. WIll sign off. Thanks.   Electronic Signatures: Oh, Paul (MD)  (Signed 23-Apr-14 14:26)  Authored: Chief Complaint, VITAL SIGNS/ANCILLARY NOTES, Brief Assessment, Lab Results, Assessment/Plan   Last Updated: 23-Apr-14 14:26 by Oh, Paul (MD) 

## 2015-04-04 NOTE — H&P (Signed)
PATIENT NAME:  Caitlin Caldwell, Caitlin Caldwell MR#:  [id number] DATE OF BIRTH:  [birth year]  DATE OF ADMISSION:  12/06/2012  PRIMARY CARE PHYSICIAN: Dr. Silver HugueninAileen Miller.   REFERRING PHYSICIAN: Dr. Mayford KnifeWilliams.   CHIEF COMPLAINT: Generalized weakness, weight gain and shortness of breath.   HISTORY OF PRESENT ILLNESS: The patient is a 81 year old Caucasian female with a past medical history of coronary artery disease, status post CABG in the year 1997, insulin-dependent diabetes mellitus, hypertension, degenerative joint disease, osteoporosis, B12 and iron deficiency anemia, hypertriglyceridemia, osteoporosis, recurrent UTIs, gallstones, depression and anxiety, valvular heart disease with echocardiogram in May has revealed aortic valve sclerosis and ejection fraction of 55% and moderate mitral valve stenosis and a recent history of partial colectomy at [facility] regarding GI bleed from diverticulitis, is presenting to the ER with a chief complaint of shortness of breath, approximately 25 pounds of weight gain and generalized weakness. The patient was recently discharged [facility] on October 1 for rehabilitation after she had a partial colectomy done for diverticulitis. The patient was discharged from the rehab center yesterday to home. The patient was reporting that she has gained a total of 50 pounds of weight during her stay in the rehabilitation center. She has reported that before joining the rehab center, her weight was 128 pounds. Today, she weighs 153 pounds in the ER. The patient went home yesterday but was feeling short of breath with minimal exertion. She also has reported that she gained 20 to 30 pounds during her course of stay in the rehab center. Denies any chest pain. The patient was not able to manage herself and was having a hard time in ambulation without any assistance. The patient came into the ER. Her first set of troponins were negative, and BNP is elevated at  26,346. Creatinine is also elevated at 1.94, and her normal in January 2012 was 1.26. The patient has received Lasix 40 mg IV and also IV Rocephin for a possible urinary tract infection per urinalysis and hospitalist team is called to admit the patient. During my examination, the patient denies any chest pain and shortness of breath is slightly better. She had 1 episode of vomiting today but denies any nausea or vomiting after getting antinausea medication in the ER. Her son is at the bedside. The patient denies any fevers or any other medical complaints.   PAST MEDICAL HISTORY:  1. Insulin-dependent diabetes mellitus.   2. Coronary artery disease status post CABG in 1997.  3. Hypertension.  4. Osteoporosis.  5. Degenerative joint disease.  6. B12 and iron deficiency anemia.  7. Valvular heart disease. Echocardiogram in May 2010 showed aortic valve sclerosis and ejection fraction of 55% and moderate mitral valve stenosis.  8. Hypertriglyceridemia.  9. Depression and anxiety.  10. Recurrent urinary tract infections.  11. Gallstones.  12. History of esophagitis with ulceration per EGD in 2009.  13. Atrial fibrillation. The patient is not on Coumadin but takes aspirin.   PAST SURGICAL HISTORY: Coronary artery bypass grafting, partial colectomy and 12 inches of colon was [facility], herniated disk, thyroidectomy, tonsillectomy, ganglion cyst resection from the right hand, hysterectomy and fibroid resection in 1971.   ALLERGIES: BENADRYL, FOSAMAX, LEVAQUIN AND LODINE.   HOME MEDICATION LIST: Zinc sulfate 220 mg once daily, vitamin C 500 mg twice a day, ranitidine 150 mg orally once a day, Zocor 40 mg p.o. once daily, potassium chloride 20 mg 1 tablet once daily, NovoLog  15 units subcutaneous 3 times, multivitamin once a day, melatonin 3 mg once a day, loperamide 2 mg every 4 hours as needed for diarrhea,  Feosol 325 mg once a day, calcium 600 with vitamin D 2 tablets  once a day, aspirin 81 mg once a day, Allegra 24-hour 1 tablet once a day, on an as-needed basis. The patient has reported that she is taking Humulin N 50 units in a.m. and she takes 32 units in the p.m.   PSYCHOSOCIAL HISTORY: Lives at home with son. Denies smoking, alcohol or illicit drug usage.   FAMILY HISTORY: Positive for coronary artery disease. Father had coronary artery disease at age 33, and mother had myocardial infarction x2.   REVIEW OF SYSTEMS:  CONSTITUTIONAL: Denies any fever but complaining of weakness. Denies any pain. Gained approximately 25 to 30 pounds recently while she was in rehab center.  EYES: Denies any blurry vision, redness, inflammation, glaucoma, cataracts.  ENT: Denies any tinnitus, ear pain, hearing loss, snoring, postnasal drip, sinus pain. Denies any difficulty in swallowing.  RESPIRATORY: Denies cough, wheezing. Complaining of shortness of breath. Denies any painful respirations.  CARDIOVASCULAR: Denies chest pain, orthopnea, palpitations, edema or erythema.   GASTROINTESTINAL: One episode of vomiting. Some amount of nausea. Denies diarrhea, abdominal pain, hematemesis, melena, ulcers or jaundice.   GENITOURINARY: Denies dysuria, hematuria, renal calculi.  ENDOCRINE: Denies polyuria, polyphagia, polydipsia, nocturia, thyroid problems.  GYNECOLOGY AND BREASTS: Denies any breast mass or vaginal discharge.  HEMATOLOGIC AND LYMPHATIC: Chronic anemia. Denies any easy bruising, bleeding.  INTEGUMENTARY: Denies acne, rash, lesions.  MUSCULOSKELETAL: Denies any pain in the neck, back, shoulder. Limited activity from 20 to 30-pound weight gain.  NEUROLOGIC: Denies any vertigo, ataxia, dementia, headache, migraine, seizures, memory loss.  PSYCHIATRIC: Denies any insomnia, ADD, OCD, nervousness.   PHYSICAL EXAMINATION:  VITAL SIGNS: Temperature 99.1, pulse 77, respiratory rate 18, blood pressure is 141/79, saturating 99% to 100% on 2 liters.  GENERAL APPEARANCE: Pale  looking, not in acute distress. Moderately built and moderately nourished.  HEENT: Normocephalic, atraumatic. Pupils are equally reacting to light and accommodation. Extraocular movements are intact. No scleral icterus. No nasal congestion. No sinus tenderness. No postnasal drip. No ear discharge. Tympanic membranes are intact.  NECK: Supple. No JVD. No carotid bruits. No thyromegaly.  LUNGS: Moderate air entry, decreased at the lower lung fields. No accessory muscle usage. No anterior chest wall tenderness on palpation.  CARDIOVASCULAR: Irregularly irregular. Positive systolic murmur. Peripheral edema 3 to 4+. Pulses are feebly palpable from edema. There are 1+ peripheral pulses.  GASTROINTESTINAL: Soft. Bowel sounds are positive. Distended, edematous externally with indentation marks from the clothes. No masses felt.  NEUROLOGIC: Awake, alert and oriented x3. Answering questions appropriately. Cranial nerves II through XII are grossly intact. Motor and sensory are intact. Reflexes are 2+. No CV angle tenderness.  SKIN: No acne lesions, rashes. No jaundice. Warm to touch. Normal turgor.  EXTREMITIES: Bilateral lower extremities and abdominal area edematous with anasarca picture. Pitting edema 3 to 4+ is noticed in the extremities. No cyanosis. No clubbing.  MUSCULOSKELETAL: No joint effusion. No edema. No tenderness.  PSYCHIATRIC: Normal mood and affect.    LABS AND IMAGING STUDIES: Portable chest x-ray revealed obliterated costophrenic angle and pleural effusions both in the right and left lower lung fields. Urinalysis cloudy in appearance, glucose 150, blood 1+, pH is 6.0, nitrite negative, leukocytes 2+, hyaline casts 3 per low-power field. BNP is 26,346. CK total 77. CPK-MB is 0.9. WBC 10.6, hemoglobin 9.6,  hematocrit 31.6, platelet count is 218,000. MCV 92. Glucose 429, BUN 61, creatinine 1.94, sodium is 137, potassium 4.1, chloride 98, CO2 of 27, calcium 9.0. Bili total 11.7, alkaline phosphatase  160, ALT 8, AST 27. Troponin-Caldwell 0.03. GFR 25. Serum osmolality 309.   ASSESSMENT AND PLAN: A 81 year old female presenting to the ER with chief complaint of shortness of breath, weight gain of 25 pounds and difficulty in ambulation. Will be admitted for the following assessment and plan.  1. Acute exacerbation of congestive heart failure, probably diastolic as her ejection fraction from 2010 was normal at 55%.  Plan:  A. Admit to telemetry bed.  B. Lasix 40 mg intravenous q. 6 hours with close monitoring of the renal function.  C. Strict ins and outs.  D. Fluid restriction to 1200 mL per day.  E. Monitor daily weights.  F. The patient might be benefited with Lasix drip if there is no significant weight loss from Lasix 40 mg intravenous q. 6 hours.  G. Will obtain 2-D echocardiogram.  H. Cycle cardiac biomarkers x3.  I. Continue oxygen via nasal cannula.  J. Nitroglycerin as needed for chest pain, aspirin, beta blocker and statin.  2. Urinary tract infection: Will obtain urine culture and sensitivity. Will give her intravenous Rocephin.  3. Acute-on-chronic renal insufficiency: Baseline creatinine is at 1.25 from the year 2012. Will hold metformin and hydrochlorothiazide. Will monitor renal function closely. If necessary, will obtain renal ultrasound.  4. Ambulatory dysfunction: Probably from significant weight gain from the fluid accumulation/anasarca picture. Will obtain physical therapy consult.  5. Insulin-dependent diabetes mellitus: Will continue NPH and put her on high-dose sliding scale.  6. Coronary artery disease, status post coronary artery bypass graft: Stable in nature. Will continue oxygen, nitroglycerin as needed, aspirin, beta blocker and statin.  7. Recent history of partial colectomy: Will obtain medical records from [facility]. Consult case management regarding discharge planning and home health arrangement.  8. Will provide her deep venous thrombosis prophylaxis with heparin  subcutaneous. GI prophylaxis will be given with ranitidine.   CODE STATUS: DO NOT RESUSCITATE.   The diagnosis and plan of care was discussed in detail with the patient and her son at bedside. They both verbalized understanding of the plan.   TOTAL TIME SPENT ON ADMISSION: 60 minutes    ____________________________ Ramonita Lab, MD ag:gb D: 12/07/2012 00:02:39 ET Caldwell: 12/07/2012 03:09:52 ET JOB#: [id number]  cc: Ramonita Lab, MD, <Dictator> Ramonita Lab MD ELECTRONICALLY SIGNED 12/17/2012 1:55

## 2015-04-04 NOTE — Discharge Summary (Signed)
PATIENT NAME:  Caitlin Caldwell, Caitlin Caldwell MR#:  [id number] DATE OF BIRTH:  [birth year]  DATE OF ADMISSION:  12/06/2012  DATE OF DISCHARGE:  12/15/2012  DISPOSITION: [facility].   DISCHARGE DIAGNOSES: 1.  Acute on chronic renal failure with chronic kidney disease stage III.  2.  Acute on chronic diastolic heart failure with right heart failure.  3.  Escherichia coli urinary tract infection.  4.  Diabetes mellitus type 2.  5.  Osteoporosis.  6.  Iron deficiency anemia.  7.  Depression.  8.  Chronic atrial fibrillation.  9.  Deconditioning.   CONSULTATIONS:   Nephrology consult with Dr. Mady Haagensen. Cardiology consult with Dr. Lady Gary.   DISCHARGE MEDICATIONS:  Multivitamin 1 tablet p.o. daily, Allegra 24-hour allergy relief 1 tablet daily, calcium with vitamin D 2 tablets daily, BuSpar 10 mg p.o. t.i.d., ferrous sulfate 325 mg p.o. b.i.d,, aspirin 81 mg daily, NovoLog FlexPen 15 units 3 times a day before meals. She is on Pravachol 40 mg p.o. daily, zinc sulfate 220 mg 1 tablet daily, ranitidine 150 mg p.o. daily, melatonin 3 mg p.o. daily, vitamin C 500 mg p.o. b.i.d., atenolol 50 mg p.o. daily, KCl 10 Eq p.o. daily, Amaryl 4 mg p.o. b.i.d., amlodipine 5 mg p.o. daily,  Glucerna shake 1 shake p.o. b.i.d., fluticasone nasal spray, Gemfibrozil 600 mg p.o. b.i.d., furosemide 40 mg every other day.   FOLLOWUP INSTRUCTIONS: Follow up with Dr. Mady Haagensen in less than a week. Advised to stop Demadex and Levemir and loperamide.  Diet: Low sodium, low fat, ADA diet.  Patient is going to [facility] rehab.   HOSPITAL COURSE: A 81 year old female patient with a history of diabetes, coronary artery disease with CABG in 1997, hypertension, iron deficiency anemia, aortic valve sclerosis,  severe tricuspid regurgitation, depression, atrial fibrillation, history of partial colectomy in September at [facility], came in to [facility] because ofsob, pedal edema and weight gain. < The patient had a  colectomy surgery at [facility], and then she went to a nursing home in [facility], and the patient suffered a lot of weight gain after she was discharged from the nursing home. The patient says she had weight gain of about 25 pounds, and she was discharged from [facility], and then went to [facility] on October 1, and she was discharged from the rehab on December 24.  She was admitted to the hospitalist service because of acute on chronic renal failure and acute diastolic heart failure. The patient complained of weight gain of about 50 pounds during her stay in rehab. The patient's BNP was elevated to 26,346, and creatinine was elevated to 1.94. In January 2012, it was 1.26.  She has received Lasix 40 mg in the ER, and she was admitted to the hospitalist service, and she was started on Rocephin for UTI. The patient was admitted to telemetry, started on fluid restriction, along with daily weights, and patient seen by nephrology, Dr. Mady Haagensen, and also cardiology, Dr. Lady Gary. Initial troponin was 0.03. Initial BUN was 60 and creatinine 1.94. WBC was 10.6. Normal CKs.  The patient's chest x-ray showed some pulmonary edema on admission. The patient's creatinine went up to 2.10 on December 26 with Lasix, and she was seen by Dr. Heide Spark. She had ultrasound of her kidneys, which showed medical renal disease and a small cyst in the right upper pole of the kidney. The Lasix was stopped, and the patient's creatinine continued to rise to 2.36, so the nephrologist  recommended giving albumin and Lasix together.  She received 1 dose of Lasix and albumin for her CHF symptoms. She received albumin 12.5 grams on the 28th, along with Lasix, but we could not give further doses because she had hypotension, so the Lasix was not given and also albumin was not given. The patient's blood pressure nicely improved with stopping the Lasix and also decreasing the Norvasc dose. The patient was seen by Dr. Mady HaagensenMunsoor Lateef on a  regular basis. She was started on IV fluids. She received IV fluids normal saline at 40 mL/hour from December 31 to January 2.  That helped her with the creatinine. Creatinine was 2.05 on December 31 and January 1, it was 1.98. At that point, the patient was seen by kidney doctor, and patient's creatinine continued to stay around 1.9, so Dr. Cherylann RatelLateef has ordered Lasix with albumin yesterday. She received a dose of Lasix and albumin. She got 40 mg of Lasix along with albumin 25 grams yesterday. The patient was seen by Heide SparkMunson Lateef today. The patient's creatinine dropped to 1.84. He suggested that she can be on Lasix 40 mg every other day, and follow up with them as an outpatient.  The patient was also seen by Dr. Lady GaryFath, and they initially thought that she can get some Aquapheresis for her CHF, but due to hypotension that was canceled.  Clinically, the lungs are clear. Her CHF seems to have be stabilized, so Aquapheresis is not done. The patient continued on her amlodipine for blood pressure and atenolol also.   DISCHARGE DIAGNOSES AND PLAN: 1.  Diabetes mellitus type 2. The patient is on Lantus and Amaryl at home, along with NovoLog FlexPen. The patient initially was started on Amaryl 4 mg p.o. b.i.d.  She had an episode of hypoglycemia of 22 on December 26.  At that time, Amaryl was stopped, and her blood glucose improved nicely, and again, Amaryl that started 2 mg p.o. b.i.d. and Levemir was discontinued. The patient's blood sugars have been raising up to 300s, so we resumed Amaryl 4 mg p.o. b.i.d. Right now, she is on Amaryl 4 mg p.o. b.i.d.  and NovoLog FlexPen. She can be monitored closely and probably restart the Levemir at a very low dose.  2.  History of hyperlipidemia on statins, continue them.  3.  Urinary tract infection with Escherichia coli.  Finish the Rocephin for 7 days.  4. Deconditioning, seen by physical therapist. The patient was able to ambulate about 44 feet with a rolling walker, and  physical therapy recommended short-term rehab, and she got a place today at [facility], and she can go to [facility] today.   TIME SPENT ON DISCHARGE PREPARATION: More than 30 minutes.   Follow up with Dr. Thedore MinsSingh in less than a week. Follow up on the kidney function and clinical course.  ____________________________ Katha HammingSnehalatha Meldon Hanzlik, MD sk:dm D: 12/15/2012 12:02:00 ET T: 12/15/2012 12:30:08 ET JOB#: [id number]  cc: Katha HammingSnehalatha Nahal Wanless, MD, <Dictator> Katha HammingSNEHALATHA Makita Blow MD ELECTRONICALLY SIGNED 01/08/2013 9:05

## 2015-04-04 NOTE — Consult Note (Signed)
Chief Complaint:  Subjective/Chief Complaint Pt denies any abd pain.  pTT remains elevated, therefore radiologist likely will not perform paracentesis.  Denies N/V.   VITAL SIGNS/ANCILLARY NOTES: **Vital Signs.:   04-Aug-14 04:12  Temperature Temperature (F) 98.7  Celsius 37  Temperature Source oral  Pulse Pulse 71  Respirations Respirations 20  Systolic BP Systolic BP 118  Diastolic BP (mmHg) Diastolic BP (mmHg) 71  Mean BP 86  Pulse Ox % Pulse Ox % 97  Pulse Ox Activity Level  At rest  Oxygen Delivery 3L   Brief Assessment:  GEN well nourished, no acute distress, chronically ill appearing.  Pleasant.  A/Ox3   Cardiac Regular  Irregular   Respiratory normal resp effort   Gastrointestinal details normal Soft  Nontender  +tense ascites   EXTR negative cyanosis/clubbing, negative edema   Additional Physical Exam skin: warm, dry   Lab Results:  Routine Coag:  04-Aug-14 03:56   Activated PTT (APTT)  43.9 (A HCT value >55% may artifactually increase the APTT. In one study, the increase was an average of 19%. Reference: "Effect on Routine and Special Coagulation Testing Values of Citrate Anticoagulant Adjustment in Patients with High HCT Values." American Journal of Clinical Pathology 2006;126:400-405.)  Prothrombin  17.8  INR 1.5 (INR reference interval applies to patients on anticoagulant therapy. A single INR therapeutic range for coumarins is not optimal for all indications; however, the suggested range for most indications is 2.0 - 3.0. Exceptions to the INR Reference Range may include: Prosthetic heart valves, acute myocardial infarction, prevention of myocardial infarction, and combinations of aspirin and anticoagulant. The need for a higher or lower target INR must be assessed individually. Reference: The Pharmacology and Management of the Vitamin K  antagonists: the seventh ACCP Conference on Antithrombotic and Thrombolytic Therapy. Chest.2004 Sept:126 (3suppl):  [id number]. A HCT value >55% may artifactually increase the PT.  In one study,  the increase was an average of 25%. Reference:  "Effect on Routine and Special Coagulation Testing Values of Citrate Anticoagulant Adjustment in Patients with High HCT Values." American Journal of Clinical Pathology 2006;126:400-405.)   Assessment/Plan:  Assessment/Plan:  Assessment Tense Ascites/Anasarca of unknown origin in setting of CHF:  Rapid accumulation.  Awaiting paracentesis via radiologist, however pTT not declining.  Will initiate liver disease work-up & check Ca 125 for malignant ascites.  Obtain further imaging of liver w/ dopplers to look for portal HTN.  I have discussed her care with Dr Servando SnareWohl this morning. Hx PUD: Will need FU EGD outpatient   Plan 1) Paracentesis once pTT normal per radiologist.   2) ASA/heparin on hold 3) fluid for cytology, cell ct diff, albumin, LDH, cultures 4) Hepatic US today w/ dopplers 5) AMA, ANA, anti-SMA, A-1 antitrypsin, ceruloplasmin, iron/ferritin, immunoglobulins, Hep BsAg, HCV ab, AFP, Ca-125   Electronic Signatures: Joselyn ArrowJones, Trae Bovenzi L (NP)  (Signed 04-Aug-14 14:44)  Authored: Chief Complaint, VITAL SIGNS/ANCILLARY NOTES, Brief Assessment, Lab Results, Assessment/Plan   Last Updated: 04-Aug-14 14:44 by Joselyn ArrowJones, Valerye Kobus L (NP)

## 2015-04-04 NOTE — Consult Note (Signed)
Active bleeding from prepyloric ulcer with large adherent clot. Unable to stop bleeding with epin injection/cautery. NG placed for suction. Hospitalist contact. Son contacted. Surgery, Dr. Egbert GaribaldiBird, contact for possible surgery today if bleeding does not stop on own. Would transfuse more units today. I will be out until Monday. Dr. Servando SnareWohl will cover over the weekend. Thanks.  Electronic Signatures: Lutricia Feilh, Raeonna Milo (MD)  (Signed on 17-Apr-14 14:48)  Authored  Last Updated: 17-Apr-14 14:48 by Lutricia Feilh, Khamiyah Grefe (MD)

## 2015-04-04 NOTE — Consult Note (Signed)
PATIENT NAME:  Yong ChannelYLOR, Tashanda T MR#:  [id number] DATE OF BIRTH:  [birth year]  DATE OF CONSULTATION:  03/29/2013  REFERRING PHYSICIAN:   CONSULTING PHYSICIAN:  Ezzard StandingPaul Y. Bluford Kaufmannh, MD  HISTORY OF PRESENT ILLNESS: The patient is a 81 year old white female who started developing melena since 2:00 yesterday afternoon. This continued until she came to the Emergency Room where she continued to pass melena. She denied having abdominal pain, but did have some nausea without vomiting. She did complain of generalized weakness. Initially, her systolic blood pressure was only in the 60s. Initial hemoglobin was only 6.2.   She does have a history of esophagitis with ulcerations back in 2009. She had colon polyps removed, but when she had her last colonoscopy in 2010 everything was normal. She tells me she had a [facility] "[facility]ise[facility]."  I asked her whether she had diverticulitis or not. She was not sure.   She was immediately given 2 liters of normal saline without rise in blood pressure. However, after she was given 2 units of uncrossed matched blood she felt better and her blood pressure when I saw her was actually running 140.   According to the family, she does take baby aspirin daily. There are no other blood thinners.   ADDITIONAL PAST MEDICAL HISTORY: Includes congestive heart failure and atrial fibrillation. She is not on anticoagulation for this. She had bypass surgery in 1997.  Other history includes hypertension, renal insufficiency and arthritis. There is also mention of B12 deficiency as well.   PAST SURGICAL HISTORY: Includes coronary artery bypass surgery and partial colectomy. She has had thyroidectomy, hysterectomy and ganglion cyst removal.   ALLERGIES: LODINE, LEVAQUIN, FOSAMAX AND BENADRYL.  HOME MEDICATIONS:  Are many including Zantac 150 mg daily, vitamin C, Zantac daily, pravastatin 40 mg daily, multivitamins, melatonin, potassium 0.5 mg daily,  insulin in the morning and evening, gemfibrozil 2 times a day, Lasix 20 mg daily, iron, Cipro eye drops, calcium, BuSpar and atenolol in addition to a baby aspirin.   SOCIAL HISTORY:  She denies alcohol or tobacco use.   FAMILY HISTORY: Notable for coronary artery disease.   REVIEW OF SYSTEMS: Her main complaint is just generalized weakness. There is no visual or hearing change.  There is no coughing or shortness of breath. There is no chest pain or palpitation. GI symptoms have been described already.  All bowel movements are regular, except since yesterday she has had melena. The rest of the review of symptoms is negative.   PHYSICAL EXAMINATION: GENERAL: The patient is in no acute distress right now, but she looks quite pale still.  VITAL SIGNS:  Are normal at this point.  HEENT: Normocephalic, atraumatic head. Pupils are equally reactive. Throat was clear.  NECK: Supple.  HEART:  Regular rhythm and rate without murmurs.  LUNGS: Clear bilaterally.  ABDOMEN: Normoactive bowel sounds. It was soft and nontender. There is some mild distention of the abdomen and showed active bowel sounds.  EXTREMITIES: No clubbing, cyanosis or edema.  SKIN: Negative.  NEUROLOGIC: Nonfocal.   LABORATORY:  Her white count was 69.6, hemoglobin 6.2 and platelet count 202. PT/INR was 1.5. BUN is 86 and creatinine is 1.89.   ASSESSMENT AND PLAN: This is a patient with most likely upper GI bleeding either with ulcer disease or severe esophagitis.  The patient will be going to ICU and be placed on Protonix drip.  Will continue IV hydration. She will be kept n.p.o.  We will see what her hemoglobin is after her blood transfusion. She may need further transfusions later on. We will plan on doing endoscopy today to find the source of bleeding. If there is active bleeding, the area will be cauterized. Thank you for the referral. ____________________________ Ezzard Standing. Bluford Kaufmann, MD pyo:sb D: 03/29/2013 13:01:21 ET    T: 03/29/2013  13:19:16 ET        JOB#: [id number] cc: Ezzard Standing. Bluford Kaufmann, MD, <Dictator> Ezzard Standing Kiandria Clum MD ELECTRONICALLY SIGNED 04/06/2013 11:07

## 2015-04-04 NOTE — Discharge Summary (Signed)
PATIENT NAME:  Caitlin Caldwell, Caitlin Caldwell MR#:  [id number] DATE OF BIRTH:  1934/06/19  DATE OF ADMISSION:  01/03/2013  DATE OF DISCHARGE:  01/08/2013  REASON FOR ADMISSION: Altered mental status due to severe hypoglycemia.   DISCHARGE DIAGNOSES: 1. Metabolic encephalopathy due to hypoglycemia.  2.  Hypoglycemia due to insulin treatment.  3.  Type 2 diabetes, insulin-dependent, controlled.  4.  Hypotension, due to intravascular volume depletion.  5.  Dehydration.  6.  Acute on chronic respiratory failure.  7.  Conjunctivitis.  8.  Atrial fibrillation.  9.  Chronic diastolic congestive heart failure, stable, compensated.  10.  Acute kidney injury.  11.  History of colon cancer.  12.  Coronary artery bypass grafting/coronary artery disease.  13.  History of degenerative joint disease of the back.  14.  Osteoporosis.  15.  Iron deficiency anemia.  16.  Depression.  17.  Chronic atrial fibrillation.  18.  Chronic deconditioning.  19.  Valvular heart disease.  20.  Pulmonary hypertension.  21.  Hypertriglyceridemia.  22.  Esophagitis.   HOSPITAL COURSE:  The patient is a very nice 81 year old female who was being admitted in January due to acute on chronic renal failure, CHF. She was discharged without problems on 12/15/2012. At that time, she was in good condition, but she comes back on 01/03/2013 with altered mental status, metabolic encephalopathy due to hypoglycemia. Apparently, the dose of her insulin been increased and it was much higher than when the patient was discharged on January 3.  She was found to be about 25 mg/dL glucose on her blood screen when was found by the EMS. At that moment, she was unresponsive. She was given D50W x1 time, and she improved quickly. Her alertness improved. She was seen in the emergency department after that,  and her blood sugar dropped again to 17, for what she was given more glucose and put on the D10W infusion.   The patient was admitted. Apparently, she  was also taking Amaryl and 50 units 3 times a day of NovoLog  FlexPen. She was taking insulin NPH 30 units to 36 units twice daily and Humalog 30 units 3 times daily. The patient improved quickly after she was given D10. She had better blood sugars on the D10, but still on the low side,  89 to 91 on day #1 after admission. On day #2, the patient had still blood sugars around 60, despite the fact that she was on D10 drip. She was taken off the drip after her blood sugars normalized, and was put on insulin NPH 10 units twice daily alone. Her blood sugars remained stable after that, and it was recommended for her to have more frequent meals, small amounts, and to not administer insulin whenever she was not eating.   IMPORTANT TESTS: Her blood sugars at discharge were around 111 to 118. She had acute kidney injury with a creatinine going up to 1.93. At discharge, it is 1.67. The patient had a creatinine on admission of 1.46. She has chronic kidney disease, and her baseline is around that. She had recommendations to be checked again by her primary care physician in the next couple of days. Her hemoglobin was around 9; that was due to dilution after the IV fluids. There were  no signs of acute bleeding. Her UA on admission was unremarkable or to increase protein, but no signs of infection. Her EKG showed atrial fibrillation, which is a chronic problem for her.   A CT scan of  the head was done showing atrophy, but no acute intracranial abnormalities. There was chronic microvascular ischemic disease.   Other problems remained stable during this hospitalization, and the patient was discharged in good condition. She was discharged with home health. She is DO NOT RESUSCITATE code. Her therapy was recommended to be done as tolerated.    MEDICATIONS AT DISCHARGE:  FeroSul 325 mg twice daily, aspirin 81 mg once daily, melatonin 3 mg at bedtime, vitamin C 500 mg twice daily, atenolol 50 mg once daily, gemfibrozil 600 mg  twice daily, promethazine 150 mg once daily, zinc sulfate once daily, pravastatin 40 mg once daily, Tylenol p.r.n., buspirone 10 mg 2 times per day, calcium 600 mg twice daily plus vitamin D, multivitamins with iron once daily, Klor-Con 20 mg take 1/2 tablets once a day, fexofenadine 180 mg once a day, insulin NPH 10 units twice daily, this is a reduction in the dose; furosemide 40 mg 1/2 tablets once daily, this is also a reduction of the dose; and ciprofloxacin drops to affected eye.   MEDICATIONS TO BE STOPPED: Glimepiride and Humalog.    GENERAL RECOMMENDATIONS:  1.  Since the patient was not eating very well and not drinking very well, apparently there were several days the patient did not have enough food or drink, and her insulin was kept the same, so we decided to give recommendations about drinking a little bit more fluids and decreasing the amount of Lasix a little bit, to half of the dose, since the patient was fairly dehydrated and avoiding acute kidney injury.  2.  Her weight needs to be monitored very closely to make sure that the changes on her therapy do not actually work against  her CHF.  3.  The patient to follow up in 1 to 2 weeks with her primary care physician, Dr. Silver Huguenin.    TIME SPENT: I spent about 40 minutes with this discharge.    ____________________________ Felipa Furnace, MD rsg:dm D: 01/10/2013 13:23:00 ET Caldwell: 01/10/2013 14:11:03 ET JOB#: [id number]  cc: Felipa Furnace, MD, <Dictator> Yetta Flock, MD Pearletha Furl MD ELECTRONICALLY SIGNED 01/11/2013 18:58

## 2015-04-04 NOTE — Consult Note (Signed)
Chief Complaint:  Subjective/Chief Complaint Pt denies any abd pain.  Denies increasing SOB.  pTT remains elevated.   VITAL SIGNS/ANCILLARY NOTES: **Vital Signs.:   31-Jul-14 08:00  Vital Signs Type Routine  Temperature Temperature (F) 97.6  Celsius 36.4  Temperature Source oral  Pulse Pulse 61  Respirations Respirations 18  Systolic BP Systolic BP 116  Diastolic BP (mmHg) Diastolic BP (mmHg) 56  Mean BP 76  Pulse Ox % Pulse Ox % 97  Pulse Ox Activity Level  At rest  Oxygen Delivery 2L; Nasal Cannula   Brief Assessment:  GEN well nourished, no acute distress, chronically ill appearing.  Pleasant.  A/Ox3   Cardiac Regular  Irregular   Respiratory normal resp effort   Gastrointestinal details normal Soft  Nontender  +tense ascites   EXTR negative cyanosis/clubbing, negative edema   Additional Physical Exam skin: warm, dry   Assessment/Plan:  Assessment/Plan:  Assessment Tense Ascites/Anasarca in setting of CHF:  Awaiting paracentesis Hx PUD: Will need FU EGD   Plan 1) Paracentesis once pTT normal per radiologist.  Will discuss w/ Dr Servando SnareWohl 2) ASA/heparin on hold 3) fluid for cytology, cell ct diff, albumin, LDH, cultures   Electronic Signatures for Addendum Section:  Joselyn ArrowJones, Allycia Pitz L (NP) (Signed Addendum 31-Jul-14 14:16)  Spoke w/ radiologist, Dr Excell Seltzerooper.  Orders placed for diagnostic paracentesis tomorrow.  Benefits outweigh risks.  Pt agrees.  NPO after MN.   Electronic Signatures: Joselyn ArrowJones, Lacrystal Barbe L (NP)  (Signed 31-Jul-14 12:31)  Authored: Chief Complaint, VITAL SIGNS/ANCILLARY NOTES, Brief Assessment, Assessment/Plan   Last Updated: 31-Jul-14 14:16 by Joselyn ArrowJones, Georgene Kopper L (NP)

## 2015-04-04 NOTE — Consult Note (Signed)
Chief Complaint:  Subjective/Chief Complaint Continues to feel weak. Small amount of black stool, according to nurse. Now on solids. Hgb remains stable. No surgery required over the weekend.   VITAL SIGNS/ANCILLARY NOTES: **Vital Signs.:   21-Apr-14 05:49  Vital Signs Type Routine  Temperature Temperature (F) 98.1  Celsius 36.7  Temperature Source oral  Pulse Pulse 102  Respirations Respirations 17  Systolic BP Systolic BP 217  Diastolic BP (mmHg) Diastolic BP (mmHg) 71  Mean BP 85  Pulse Ox % Pulse Ox % 99  Pulse Ox Activity Level  At rest  Oxygen Delivery 2L   Brief Assessment:  Cardiac Regular   Respiratory clear BS   Gastrointestinal distended. nontender. positive bowel sounds   Lab Results: Routine Chem:  21-Apr-14 05:28   Glucose, Serum  134  BUN  59  Creatinine (comp)  1.54  Sodium, Serum 143  Potassium, Serum 3.5  Chloride, Serum  113  CO2, Serum  17  Calcium (Total), Serum 8.5  Anion Gap 13  Osmolality (calc) 303  eGFR (African American)  38  eGFR (Non-African American)  32 (eGFR values <37m/min/1.73 m2 may be an indication of chronic kidney disease (CKD). Calculated eGFR is useful in patients with stable renal function. The eGFR calculation will not be reliable in acutely ill patients when serum creatinine is changing rapidly. It is not useful in  patients on dialysis. The eGFR calculation may not be applicable to patients at the low and high extremes of body sizes, pregnant women, and vegetarians.)  Routine Hem:  21-Apr-14 05:28   WBC (CBC) 7.7  RBC (CBC)  2.95  Hemoglobin (CBC)  8.8  Hematocrit (CBC)  25.8  Platelet Count (CBC)  133  MCV 87  MCH 29.9  MCHC 34.3  RDW  18.7  Neutrophil % 72.8  Lymphocyte % 13.0  Monocyte % 10.1  Eosinophil % 3.5  Basophil % 0.6  Neutrophil # 5.6  Lymphocyte # 1.0  Monocyte # 0.8  Eosinophil # 0.3  Basophil # 0.0 (Result(s) reported on 02 Apr 2013 at 05:54AM.)   Assessment/Plan:  Assessment/Plan:   Assessment Gastric ulcer. NO active bleeding.   Plan See how she does with solids. Agree with protonix po bid. Will eventually need repeat EGD to evaluate GU again. [facility] Will check back on Wed if patient still here.   Electronic Signatures: OVerdie Shire(MD)  (Signed 21-Apr-14 12:40)  Authored: Chief Complaint, VITAL SIGNS/ANCILLARY NOTES, Brief Assessment, Lab Results, Assessment/Plan   Last Updated: 21-Apr-14 12:40 by OVerdie Shire(MD)

## 2015-04-04 NOTE — Consult Note (Signed)
General Aspect This is a 81 year old female with history of bypass x3 vessels in 1997, chronic A. fib, rate controlled on atenolol, not on anticoagulation other than aspirin, due to history of gastric ulcer in April of this year, renal insufficiency, diabetes mellitus, history of moderate to severe AS, presenting to [facility] with approximately 40 pound weight gain over the last month, acute on chronic diastolic heart failure..  She did see her cardiologist Dr.Fath, who suggested that she see GI for her chronic anemia with hemoglobin currently at 9.1 and also set her up to have a repeat echo and a stress test.  She was unable to have the tests because she presented to the hospital.  Her creatinine currently is at 1.87.  She has felt some relief from diuresis.  But still feels like her abdomen is quite distended.  She got   more progressively dyspneic prior to hospitalization. Her cardiac enzymes have been negative. .She is pending consult with GI. EKG and telemetry shows atrial fibrillation rate controlled.  No acute ST changes. BNP 34,000+.   Physical Exam:  GEN well developed, no acute distress   HEENT pale conjunctivae   RESP normal resp effort   CARD A. fib rate controlled   ABD distended   EXTR mild edema   SKIN skin turgor decreased   NEURO cranial nerves intact, motor/sensory function intact   PSYCH alert, A+O to time, place, person, good insight   Review of Systems:  Subjective/Chief Complaint Edema and shortness of breath   General: Weight loss or gain  weight gain   Respiratory: Short of breath   Gastrointestinal: denies any melena   Review of Systems: All other systems were reviewed and found to be negative   Medications/Allergies Reviewed Medications/Allergies reviewed   Lab Results: Hepatic:  27-Jul-14 15:55   Bilirubin, Total 0.3  Alkaline Phosphatase 78  SGPT (ALT)  9  SGOT (AST) 18  Total Protein, Serum 7.0  Albumin, Serum  2.8  Bilirubin, Direct 0.1  (Result(s) reported on 08 Jul 2013 at 05:34PM.)  28-Jul-14 00:19   Bilirubin, Total 0.3  Alkaline Phosphatase 76  SGPT (ALT)  7  SGOT (AST)  14  Total Protein, Serum 6.7  Albumin, Serum  2.7  Routine Chem:  27-Jul-14 15:55   Glucose, Serum  212  BUN  39  Creatinine (comp)  1.87  Sodium, Serum 140  Potassium, Serum 4.1  Chloride, Serum  108  CO2, Serum 25  Calcium (Total), Serum 9.0  Osmolality (calc) 295  eGFR (African American)  30  eGFR (Non-African American)  26 (eGFR values <61m/min/1.73 m2 may be an indication of chronic kidney disease (CKD). Calculated eGFR is useful in patients with stable renal function. The eGFR calculation will not be reliable in acutely ill patients when serum creatinine is changing rapidly. It is not useful in  patients on dialysis. The eGFR calculation may not be applicable to patients at the low and high extremes of body sizes, pregnant women, and vegetarians.)  Anion Gap 7  B-Type Natriuretic Peptide ([facility]  [phone](Result(s) reported on 08 Jul 2013 at 05:38PM.)  28-Jul-14 00:19   Glucose, Serum  167  BUN  39  Creatinine (comp)  1.85  Sodium, Serum 141  Potassium, Serum 3.7  Chloride, Serum 106  CO2, Serum 26  Calcium (Total), Serum 9.1  Osmolality (calc) 294  eGFR (African American)  30  eGFR (Non-African American)  26 (eGFR values <642mmin/1.73 m2 may be an indication of chronic kidney disease (CKD).  Calculated eGFR is useful in patients with stable renal function. The eGFR calculation will not be reliable in acutely ill patients when serum creatinine is changing rapidly. It is not useful in  patients on dialysis. The eGFR calculation may not be applicable to patients at the low and high extremes of body sizes, pregnant women, and vegetarians.)  Anion Gap 9  B-Type Natriuretic Peptide [facility])  [phone] (Result(s) reported on 09 Jul 2013 at 12:55AM.)  Cardiac:  27-Jul-14 15:55   CK, Total 68  CPK-MB, Serum  < 0.5 (Result(s) reported  on 08 Jul 2013 at 04:32PM.)  Troponin I < 0.02 (0.00-0.05 0.05 ng/mL or less: NEGATIVE  Repeat testing in 3-6 hrs  if clinically indicated. >0.05 ng/mL: POTENTIAL  MYOCARDIAL INJURY. Repeat  testing in 3-6 hrs if  clinically indicated. NOTE: An increase or decrease  of 30% or more on serial  testing suggests a  clinically important change)  28-Jul-14 00:19   CK, Total 54  CPK-MB, Serum 1.0 (Result(s) reported on 09 Jul 2013 at 12:56AM.)  Troponin I 0.03 (0.00-0.05 0.05 ng/mL or less: NEGATIVE  Repeat testing in 3-6 hrs  if clinically indicated. >0.05 ng/mL: POTENTIAL  MYOCARDIAL INJURY. Repeat  testing in 3-6 hrs if  clinically indicated. NOTE: An increase or decrease  of 30% or more on serial  testing suggests a  clinically important change)    07:20   CK, Total 54  CPK-MB, Serum 0.9 (Result(s) reported on 09 Jul 2013 at 07:48AM.)  Troponin I 0.03 (0.00-0.05 0.05 ng/mL or less: NEGATIVE  Repeat testing in 3-6 hrs  if clinically indicated. >0.05 ng/mL: POTENTIAL  MYOCARDIAL INJURY. Repeat  testing in 3-6 hrs if  clinically indicated. NOTE: An increase or decrease  of 30% or more on serial  testing suggests a  clinically important change)  Routine Coag:  27-Jul-14 15:55   Prothrombin  15.8  INR 1.3 (INR reference interval applies to patients on anticoagulant therapy. A single INR therapeutic range for coumarins is not optimal for all indications; however, the suggested range for most indications is 2.0 - 3.0. Exceptions to the INR Reference Range may include: Prosthetic heart valves, acute myocardial infarction, prevention of myocardial infarction, and combinations of aspirin and anticoagulant. The need for a higher or lower target INR must be assessed individually. Reference: The Pharmacology and Management of the Vitamin K  antagonists: the seventh ACCP Conference on Antithrombotic and Thrombolytic Therapy. [id number].[id number] Sept:126 (3suppl): [id number]. A HCT value  >55% may artifactually increase the PT.  In one study,  the increase was an average of 25%. Reference:  "Effect on Routine and Special Coagulation Testing Values of Citrate Anticoagulant Adjustment in Patients with High HCT Values." American Journal of Clinical Pathology 2006;126:400-405.)  Activated PTT (APTT)  42.8 (A HCT value >55% may artifactually increase the APTT. In one study, the increase was an average of 19%. Reference: "Effect on Routine and Special Coagulation Testing Values of Citrate Anticoagulant Adjustment in Patients with High HCT Values." American Journal of Clinical Pathology 2006;126:400-405.)  Routine Hem:  27-Jul-14 15:55   WBC (CBC) 6.4  RBC (CBC)  3.30  Hemoglobin (CBC)  9.8  Hematocrit (CBC)  30.3  Platelet Count (CBC) 214  MCV 92  MCH 29.7  MCHC 32.5  RDW  15.6  Neutrophil % 71.3  Lymphocyte % 14.6  Monocyte % 10.1  Eosinophil % 2.6  Basophil % 1.4  Neutrophil # 4.6  Lymphocyte #  0.9  Monocyte # 0.7  Eosinophil # 0.2  Basophil # 0.1 (Result(s) reported on 08 Jul 2013 at 04:29PM.)  28-Jul-14 00:19   WBC (CBC) 5.4  RBC (CBC)  3.02  Hemoglobin (CBC)  9.1  Hematocrit (CBC)  27.2  Platelet Count (CBC) 199  MCV 90  MCH 30.2  MCHC 33.4  RDW  15.6  Neutrophil % 65.5  Lymphocyte % 18.5  Monocyte % 12.0  Eosinophil % 2.6  Basophil % 1.4  Neutrophil # 3.5  Lymphocyte # 1.0  Monocyte # 0.6  Eosinophil # 0.1  Basophil # 0.1 (Result(s) reported on 09 Jul 2013 at 12:44AM.)   Radiology Results: XRay:    27-Jul-14 15:55, Chest PA and Lateral  Chest PA and Lateral   PRELIMINARY REPORT    The following is a PRELIMINARY Radiology report.  A final report will follow pending radiologist verification.      REASON FOR EXAM:    short of breath; hx: chf; weight gain  COMMENTS:       PROCEDURE: DXR - DXR CHEST PA (OR AP) AND LATERAL  - Jul 08 2013  3:55PM     RESULT:     Comparison is made to prior study dated 03/31/2013.    Findings: The patient  has taken a shallow inspiration. Areas of increased   density project within the lung bases, left greater than right. There is   prominence and indistinctness of the pulmonary vasculature and   interstitial markings primarily in the perihilar regions. There is mild   peribronchial cuffing. The cardiac silhouette is enlarged indicative of   cardiomegaly. The visualized bony skeleton is unremarkable. The patient     is status post median sternotomy and coronary artery bypass grafting.     IMPRESSION:      1. Atelectasis versus infiltrate within the lung bases, left greater than   right.  2. Interstitial infiltrate possibly representing pulmonary edema,   infection versus inflammatory etiologies cannot be excluded and   surveillance evaluation recommended.      Thank you for this opportunity to contribute to the care of your patient.         Dictated By: Mikki Santee, M.D., MD    Benadryl: Unknown  Fosamax: Unknown  Levaquin: Unknown  Etodolac: Other  Reglan: Unknown  Colchicine: Unknown  Sulfa drugs: Unknown  Vital Signs/Nurse's Notes: **Vital Signs.:   28-Jul-14 08:11  Vital Signs Type Routine  Temperature Temperature (F) 98.7  Celsius 37  Temperature Source oral  Pulse Pulse 69  Respirations Respirations 20  Systolic BP Systolic BP 161  Diastolic BP (mmHg) Diastolic BP (mmHg) 52  Mean BP 70  Pulse Ox % Pulse Ox % 96  Pulse Ox Activity Level  At rest  Oxygen Delivery 2L    Impression Acute on chronic diastolic heart failure presenting with fluid overload, with known moderate to severe aortic stenosis, anemia and renal insufficiency,  all of above which may be playing a part in heart failure   Plan 1.  Continue with diuresis watching creatinine levels. 2.  Continue atenolol for rate control of atrial fibrillation. 3.  GI consult pending for chronic anemia, prior GI bleed and further recommendationsregarding future anticoagulants. 4.  Echocardiogram today to  further assess LV function, degree of aortic stenosis,  and its role in contributing to heart failure. 5. .Further recommendations per Dr. Nehemiah Massed.   Electronic Signatures: Roderic Palau (NP)  (Signed 28-Jul-14 11:17)  Authored: General Aspect/Present Illness, History and Physical Exam, Review of System, Labs, Radiology, Allergies, Vital Signs/Nurse's Notes, Impression/Plan  Last Updated: 28-Jul-14 11:17 by Roderic Palau (NP)

## 2015-04-04 NOTE — H&P (Signed)
PATIENT NAME:  Caitlin Caldwell, Caitlin Caldwell MR#:  [id number] DATE OF BIRTH:  [birth year]  DATE OF ADMISSION:  02/11/2013  PRIMARY CARE PHYSICIAN:  Dr. Yetta FlockAileen H. Miller   REFERRING PHYSICIAN:  Dr. Margarita GrizzleWoodruff   CHIEF COMPLAINT:  Worsening of the swelling in the bilateral lower extremities and redness.   HISTORY OF PRESENT ILLNESS:  The patient is a 81 year old pleasant Caucasian female with a past medical history of congestive heart failure, chronic kidney disease and chronic bilateral lower extremity edema since she had colectomy done in September 2013. She is presenting to the ER with a chief complaint of 2 to 3 pounds of weight gain in the past 2 to 3 days and worsening of swelling in her bilateral lower extremities and redness.  The patient is reporting that she has gained weight significantly since she had a colectomy done for benign lesion in September of 2013.  At that time, she was weighing 128 pounds and today she is weighing 165 pounds.  She also has reported that in the past 2 or 3 days she has gained 2 to 3 pounds.  She has chronic dyspnea and denies any worsening of the shortness of breath.  She denies any chest pain either.  She has chronic lower extremity edema for the past several months since a colectomy in September of 2013, but for the past few days it has been getting worse and she has been noticing worsening of the bullae and blebs.  Today morning her legs were really tight and she has noticed multiple blisters, which eventually ruptured spontaneously and noticed clear liquid coming out of the blisters.  Also, she has noticed worsening of the redness in her bilateral lower extremities.  She denies any chest pain, but is reporting that she is noticing swelling in her abdomen because of the fluid accumulation.  She is compliant with her medications and taking medications on a regular basis.  As patient came into the ER in the evening, she did not take her insulin tonight.  No other complaints.  Her son  dropped her in the ER and he will be coming to see her in the morning.  The patient has received Lasix in the IV form in the ER.  The patient is reporting that she used to get home health, but recently they stopped coming and she is worried about pressure ulcers on her back.   PAST MEDICAL HISTORY:  Diastolic congestive heart failure with recent ejection fraction of 50% to 55% in December 2013. Chronic atrial fibrillation, not on Coumadin; the patient is on aspirin.  Chronic renal insufficiency, coronary artery disease status post bypass grafting in 1997, hypertension, osteoporosis, degenerative joint disease, B12 deficiency, iron deficiency, valvular heart disease, insulin-dependent diabetes mellitus, osteoporosis, chronic depression, mild aortic regurgitation, chronic respiratory failure, history of colon cancer, pulmonary hypertension, hypertriglyceridemia.   PAST SURGICAL HISTORY:  Coronary artery bypass grafting. Partial colectomy; approximately 12 inches of colon was [facility] September [facility]. Thyroidectomy, tonsillectomy, ganglion cyst resection from the right hand and hysterectomy for fibroids.   ALLERGIES:  The patient is allergic to BENADRYL, FOSAMAX, LEVAQUIN AS WELL AS LODINE.   HOME MEDICATIONS:  Zinc sulfate 1 tablet once a day, vitamin C 500 mg twice a day, ranitidine 150 mg once a day, pravastatin 40 mg once daily, multivitamin once daily, melatonin 3 mg once daily, Klor-Con 20 mEq half tablet once a day, NPH insulin 10 units twice a day, gemfibrozil 600 mg 2 times a day,  furosemide 40 mg half tablet once a day, fexofenadine 180 mg once daily, iron sulfate 325 mg 2 times a day, ciprofloxacin eye drops, calcium 2 times a day, bupropion 1 tablet 3 times a day, atenolol 50 mg once daily, aspirin 81 mg once daily, Tylenol 500 mg 2 tablets every 8 hours.   PSYCHOSOCIAL HISTORY:  Lives at home with son.  Denies smoking, alcohol or illicit drug usage.   FAMILY HISTORY:   Father had coronary artery disease and acute MI at age 30.  Mother had two MIs and deceased at age 81.   REVIEW OF SYSTEMS:  CONSTITUTIONAL:  Denies fever, fatigue.  Denies any weakness.  Complaining of weight gain of 2 to 3 pounds in the past 2 days.  EYES:  Denies any redness, blurry vision, glaucoma.  EARS, NOSE, THROAT:  No ear pain, hearing loss, discharge, snoring, postnasal drip.  RESPIRATORY:  Denies COPD, coughing, wheezing, hemoptysis.  CARDIOVASCULAR:  Denies chest pain, orthopnea, palpitations.  GASTROINTESTINAL:  Denies nausea, vomiting, diarrhea, GERD.  GENITOURINARY:  No dysuria or hematuria.  GYNECOLOGIC AND BREASTS:  No breast mass or vaginal discharge.  ENDOCRINE:  Denies polyuria, nocturia, thyroid problems.  HEMATOLOGIC AND LYMPHATIC:  Chronic anemia is present.  Denies any bleeding. INTEGUMENTARY:  No rashes or acne, but patient is complaining of worsening and rupture of the blebs in her bilateral lower extremities.  Also, pressure ulcers on the back.  MUSCULOSKELETAL:  Denies any gout, chronic history of arthritis present.  Denies any neck pain, back pain, shoulder pain.  NEUROLOGIC:  No history of vertigo, ataxia, dementia.  PSYCHIATRIC:  The patient has chronic history of depression.  Denies any ADD, OCD.   PHYSICAL EXAMINATION: VITAL SIGNS:  Temperature is 99 degrees Fahrenheit, pulse 60, respirations 18, blood pressure 111/61, pulse ox 97%.  GENERAL APPEARANCE:  Moderately built and moderately nourished, not under acute distress.  The patient is a good historian.  HEENT:  Normocephalic, atraumatic.  Pupils are equally reacting to light and accommodation.  Extraocular movements are intact.  No sinus tenderness.  No postnasal drip.  Moist mucous membranes.  NECK:  Supple.  No JVD.  No thyromegaly.  LUNGS:  Minimal rales are present.  No accessory muscle usage.  No anterior chest wall tenderness.  CARDIOVASCULAR:  Irregularly irregular.  No murmurs.  GASTROINTESTINAL:   Soft.  Bowel sounds are positive in all four quadrants.  Nontender, nondistended, not edematous. No CV angle tenderness.  NEUROLOGIC:  Awake, alert, oriented x 3.  Answering questions appropriately.  Motor and sensory are grossly intact.  Cranial nerves II through XII are grossly intact.  EXTREMITIES:  Diffuse erythema is present bilaterally with ruptured blebs.  It is nontender. 2+ pitting edema is present.  PSYCHIATRIC:  Normal mood and affect.  MUSCULOSKELETAL:  No joint effusion, no tenderness, no erythema.    LABORATORY AND IMAGING STUDIES:  Glucose is 138, BNP 15,748, BUN 51, creatinine 1.67, close to her baseline.  Sodium is 141, potassium 4.3, chloride 107, CO2 23, GFR is 29, anion gap 11, serum osmolality 297, calcium is 9.2, total protein 37.2, serum albumin 3.1, bilirubin total 0.4, alkaline phosphatase 96, AST 29, ALT 13.  CK total 84.  Troponin less than 0.02.  CK-MB 1.0.  WBC 7.0, hemoglobin 9.8, hematocrit 30.7, platelet count is at 231.   ASSESSMENT AND PLAN: 1.  Bilateral lower extremity cellulitis with spontaneously ruptured blebs.  We will start the patient on IV Rocephin and consult wound care regarding wound management.  2.  Mild acute exacerbation of congestive heart failure, acute on chronic, diastolic as recent ejection fraction is at 55%.  The patient will be on Lasix 40 mg IV q. 12 hours.  We will provide her potassium supplements.  Also, the patient will be on aspirin, beta-blocker and statin.  Cycle cardiac biomarkers and check TSH.  3.  Insulin-dependent diabetes mellitus.  Resume home insulin and provide her insulin sliding scale for extra coverage.  4.  Chronic anemia.  Resume her home medications.  5.  Hypertension.  Resume her home medications and titrate medications as needed basis.  6.  History of coronary artery disease status post coronary artery bypass grafting.  The patient denies any chest pain and currently asymptomatic.  7.  Chronic atrial fibrillation,  rate-controlled.  Continue aspirin.   8.  We will provide her gastrointestinal prophylaxis and deep vein thrombosis prophylaxis.  9.  CODE STATUS IS DO NOT RESUSCITATE.   The diagnosis and plan of care was discussed in detail with the patient.  She is aware of the plan.   TIME SPENT:  50 minutes.     ____________________________ Ramonita Lab, MD ag:ea D: 02/11/2013 00:47:26 ET Caldwell: 02/11/2013 01:27:43 ET JOB#: [id number]  cc: Ramonita Lab, MD, <Dictator> Yetta Flock, MD  Ramonita Lab MD ELECTRONICALLY SIGNED 02/16/2013 1:48

## 2015-04-04 NOTE — Consult Note (Signed)
Chief Complaint:  Subjective/Chief Complaint Pt denies any abd pain, N/V.  Labs reviewed.  Cytology pending.  Cultures benign so far.   VITAL SIGNS/ANCILLARY NOTES: **Vital Signs.:   06-Aug-14 07:52  Vital Signs Type Routine  Temperature Temperature (F) 98  Celsius 36.6  Temperature Source oral  Pulse Pulse 67  Respirations Respirations 20  Systolic BP Systolic BP 103  Diastolic BP (mmHg) Diastolic BP (mmHg) 78  Mean BP 93  Pulse Ox % Pulse Ox % 90  Pulse Ox Activity Level  At rest  Oxygen Delivery Room Air/ 21 %   Brief Assessment:  GEN well nourished, no acute distress, chronically ill appearing.  Pleasant.  A/Ox3   Cardiac Regular  Irregular   Respiratory normal resp effort   Gastrointestinal details normal Soft  Nontender  +mod ascites   EXTR negative cyanosis/clubbing, negative edema   Additional Physical Exam skin: warm, dry, pale   Lab Results: BF Analysis:  04-Aug-14 12:24   Body Fluid Source (CC) PARACENTESIS FLUID  Body Fluid Source (CC) -  Color - (BF) YELLOW  Color - (BF) -  Clarity (BF) CLEAR  Clarity (BF) -  NCC  (nucleated cell count) 125  NCC  (nucleated cell count) -  Neutrophils  (BF) 32  Neutrophils  (BF) -  Lymphocytes  (BF) 35  Lymphocytes  (BF) -  Monocytes/Macrophages  (BF) 33  Monocytes/Macrophages  (BF) -  Eosinophil  (BF) 0  Eosinophil  (BF) -  Basophil  (BF) 0  Basophil  (BF) -  Other Cells  (BF) 0 (Result(s) reported on 16 Jul 2013 at 05:37PM.)  Other Cells  (BF) - (Result(s) reported on 16 Jul 2013 at 04:16PM.)  Albumin, Body Fluid 2.3 (Result(s) reported on 16 Jul 2013 at 05:00PM.)  Body Fluid Source PARACENTESIS FLUID  Result(s) reported on 16 Jul 2013 at 05:00PM.  LDH, BF 105  Hepatic:  05-Aug-14 04:32   Bilirubin, Total 0.4  Alkaline Phosphatase 64  SGPT (ALT)  < 6  SGOT (AST)  14  Total Protein, Serum  6.3  Albumin, Serum  3.3  Oncology:  04-Aug-14 09:04   AFP, Tumor Marker (Serial Monitoring) 1.4 ([facility]  methodology            [facility]            No: [id number]           [address], [place], [place] [address]           Lindon Romp, MD         [phone] Result(s) reported on 17 Jul 2013 at 02:49PM.)  Cancer Antigen (CA) 125  444.4 [facility] methodology            [facility]            No: [id number]           [address], [place], [place] [address]           Lindon Romp, MD         [phone] Result(s) reported on 17 Jul 2013 at 02:49PM.)  Routine Micro:  04-Aug-14 12:24   Micro Text Report MISC AER/ANAEROBIC CULT.   COMMENT                   NO GROWTH IN 8-12 HOURS   GRAM STAIN                NO WHITE BLOOD CELLS   GRAM STAIN  NO ORGANISMS SEEN   ANTIBIOTIC                       Specimen Source SOURCE NOT INDICATED  Culture Comment NO GROWTH IN 8-12 HOURS  Gram Stain 1 NO WHITE BLOOD CELLS  Gram Stain 2 NO ORGANISMS SEEN  Result(s) reported on 17 Jul 2013 at 03:28PM.  General Ref:  04-Aug-14 09:04   Antinuclear Antibodies Direct ========== TEST NAME ==========  ========= RESULTS =========  = REFERENCE RANGE =  ANTI-NUCLEAR ABS DIRECT  Antinuclear Antibodies Direct ANA Direct                      [   Negative             ]          Negative               [facility]            No: [id number]           [address], [place], [place] [address]           Lindon Romp, MD         [phone]   Result(s) reported on 17 Jul 2013 at 01:49PM.  Mitochondrial (M2) Antibody ========== TEST NAME ==========  ========= RESULTS =========  = REFERENCE RANGE =  MITOCHONDRIAL (M2) AB  Mitochondrial (M2) Antibody Mitochondrial (M2) Antibody     [   4.7 Units            ]          0.0-20.0              Negative    0.0 - 20.0                                                Equivocal  20.1 - 24.9                                                Positive         >24.9                                                                     .              Mitochondrial (M2) Antibodies are found in 90-96% of                patients with primary biliary cirrhosis.               [facility]            No: [id number]           [address], [place], [place] [address] Lindon Romp, MD         [phone]   Result(s) reported on 17 Jul 2013 at 02:49PM.  Anti-Smooth Muscle Antibody ========== TEST NAME ==========  =========  RESULTS =========  = REFERENCE RANGE =  ANTI-SMOOTH MUSCLE ABS  Actin (Smooth Muscle) Antibody Actin (Smooth Muscle) Antibody  [   16 Units             ]              0-19   Negative                     0 - 19                                 Weak positive               20 - 30                                 Moderate to strong positive     >30                                                                     .              Actin Antibodies are found in 52-85% of patients with                 autoimmune hepatitis or chronic active hepatitis and                 in 22% of patients with primary biliary cirrhosis.               [facility] No: [id number]           [address] [place], [place] [address]           Lindon Romp, MD         [phone]   Result(s) reported on 17 Jul 2013 at 02:49PM.  Ceruloplasmin, Serum ========== TEST NAME ==========  ========= RESULTS =========  = REFERENCE RANGE =  CERULOPLASMIN  Ceruloplasmin Ceruloplasmin                   [   23.0 mg/dL           ]         16.0-45.0               [facility]            No: [id number]           [address], [place], [place] [address]           Lindon Romp, MD         [phone]   Result(s) reported on 17 Jul 2013 at 05:48AM.  Alpha-1-Antitrypsin, Serum ========== TEST NAME ==========  ========= RESULTS =========  = REFERENCE RANGE =  ALPHA-1-ANTITRYPSIN  Alpha-1-Antitrypsin, Serum Alpha-1-Antitrypsin, Serum      [   187 mg/dL            ]             [facility]  No: [id number]           [address] [place], [place] [address]           Lindon Romp, MD         [phone]   Result(s) reported on 17 Jul 2013 at 05:48AM.  HBsAg ========== TEST NAME ==========  ========= RESULTS =========  = REFERENCE RANGE =  HEPATITIS B SURFACE AG  HBsAg Screen HBsAg Screen                    [   Negative             ]          Negative               [facility]            No: [id number]           [address], [place], [place] [address]           Lindon Romp, MD         [phone]   Result(s) reported on 17 Jul 2013 at 03:50AM.  AFP, Tumor Marker ========== TEST NAME ==========  ========= RESULTS =========  = REFERENCE RANGE =  AFP, TUMOR MARKER   CA-125 ========== TEST NAME ==========  ========= RESULTS =========  = REFERENCE RANGE =  CANCER ANTIGEN 125   Immunoglobulins, Qt, IgA, IgG, IgM, Serum ========== TEST NAME ==========  ========= RESULTS =========  = REFERENCE RANGE =  PROT IMMUNOGLOBULINS  Immunoglobulins A/G/M, Qn, Ser Immunoglobulin G, Qn, Serum     [   701 mg/dL            ]          [phone] Immunoglobulin A, Qn, Serum[   330 mg/dL            ]            91-414 Immunoglobulin M, Qn, Serum     [   77 mg/dL             ]            [address]               [facility]             No: [id number]           [address], [place], [place] [address] Lindon Romp, MD         [phone]   Result(s) reported on 17 Jul 2013 at 02:49PM.  Routine Chem:  04-Aug-14 09:04   Iron Binding Capacity (TIBC)  195  Unbound Iron Binding Capacity 163  Iron, Serum  32  Iron Saturation 16 (Result(s) reported on 16 Jul 2013 at 09:59AM.)  Ferritin ([facility]) 86 (Result(s) reported on 16 Jul 2013 at 09:46AM.)  05-Aug-14 04:32   Glucose, Serum  113  BUN  59  Creatinine (comp)  2.10  Sodium, Serum 140  Potassium, Serum 3.5  Chloride, Serum 104  CO2, Serum 27   Calcium (Total), Serum 9.1  Osmolality (calc) 297  eGFR (African American)  26  eGFR (Non-African American)  22 (eGFR values <54m/min/1.73 m2 may be an indication of chronic kidney disease (CKD). Calculated eGFR is useful in patients with stable renal function. The eGFR calculation will not be reliable in acutely ill patients when serum creatinine is changing rapidly. It is not useful in  patients on dialysis. The eGFR calculation  may not be applicable to patients at the low and high extremes of body sizes, pregnant women, and vegetarians.)  Anion Gap 9  Routine Coag:  04-Aug-14 03:56   Prothrombin  17.8  INR 1.5 (INR reference interval applies to patients on anticoagulant therapy. A single INR therapeutic range for coumarins is not optimal for all indications; however, the suggested range for most indications is 2.0 - 3.0. Exceptions to the INR Reference Range may include: Prosthetic heart valves, acute myocardial infarction, prevention of myocardial infarction, and combinations of aspirin and anticoagulant. The need for a higher or lower target INR must be assessed individually. Reference: The Pharmacology and Management of the Vitamin K  antagonists: the seventh ACCP Conference on Antithrombotic and Thrombolytic Therapy. MBOMQ.5927 Sept:126 (3suppl): N9146842. A HCT value >55% may artifactually increase the PT.  In one study,  the increase was an average of 25%. Reference:  "Effect on Routine and Special Coagulation Testing Values of Citrate Anticoagulant Adjustment in Patients with High HCT Values." American Journal of Clinical Pathology 2006;126:400-405.)  Activated PTT (APTT)  43.9 (A HCT value >55% may artifactually increase the APTT. In one study, the increase was an average of 19%. Reference: "Effect on Routine and Special Coagulation Testing Values of Citrate Anticoagulant Adjustment in Patients with High HCT Values." American Journal of Clinical Pathology  2006;126:400-405.)  05-Aug-14 04:32   Prothrombin  17.3  INR 1.4 (INR reference interval applies to patients on anticoagulant therapy. A single INR therapeutic range for coumarins is not optimal for all indications; however, the suggested range for most indications is 2.0 - 3.0. Exceptions to the INR Reference Range may include: Prosthetic heart valves, acute myocardial infarction, prevention of myocardial infarction, and combinations of aspirin and anticoagulant. The need for a higher or lower target INR must be assessed individually. Reference: The Pharmacology and Management of the Vitamin K  antagonists: the seventh ACCP Conference on Antithrombotic and Thrombolytic Therapy. GFREV.2003 Sept:126 (3suppl): N9146842. A HCT value >55% may artifactually increase the PT.  In one study,  the increase was an average of 25%. Reference:  "Effect on Routine and Special Coagulation Testing Values of Citrate Anticoagulant Adjustment in Patients with High HCT Values." American Journal of Clinical Pathology 2006;126:400-405.)  Routine Hem:  05-Aug-14 04:32   WBC (CBC) 5.8  RBC (CBC)  2.87  Hemoglobin (CBC)  8.8  Hematocrit (CBC)  26.3  Platelet Count (CBC) 190  MCV 92  MCH 30.8  MCHC 33.6  RDW  15.0  Neutrophil % 68.4  Lymphocyte % 16.2  Monocyte % 11.2  Eosinophil % 3.3  Basophil % 0.9  Neutrophil # 4.0  Lymphocyte #  0.9  Monocyte # 0.7  Eosinophil # 0.2  Basophil # 0.1 (Result(s) reported on 17 Jul 2013 at 05:53AM.)   Assessment/Plan:  Assessment/Plan:  Assessment Tense Ascites/Anasarca:  Likely secondary to cirrhosis/congestive hepatopathy.  Cannot r/o ovarian malignancy ?carcinomatosis given elevated CA 125.  Awaiting cytology. PUD: Will need FU EGD outpatient   Plan 1) FU cytology 2) Continue supportive measures   Electronic Signatures: Andria Meuse (NP)  (Signed 06-Aug-14 10:01)  Authored: Chief Complaint, VITAL SIGNS/ANCILLARY NOTES, Brief Assessment, Lab  Results, Assessment/Plan   Last Updated: 06-Aug-14 10:01 by Andria Meuse (NP)
